# Patient Record
Sex: Male | Born: 1970 | Race: Black or African American | Hispanic: No | Marital: Single
Health system: Southern US, Academic
[De-identification: ages and names within clinical notes are randomized; demographics above are authoritative.]

## PROBLEM LIST (undated history)

## (undated) ENCOUNTER — Ambulatory Visit

## (undated) ENCOUNTER — Encounter

## (undated) ENCOUNTER — Telehealth

## (undated) ENCOUNTER — Encounter: Attending: Medical | Primary: Medical

## (undated) ENCOUNTER — Encounter: Payer: MEDICAID | Attending: Ophthalmology | Primary: Ophthalmology

## (undated) ENCOUNTER — Ambulatory Visit: Attending: Otolaryngology | Primary: Otolaryngology

## (undated) ENCOUNTER — Telehealth: Attending: Family | Primary: Family

## (undated) ENCOUNTER — Ambulatory Visit: Attending: Dentist | Primary: Dentist

## (undated) ENCOUNTER — Telehealth: Attending: Clinical | Primary: Clinical

## (undated) ENCOUNTER — Telehealth: Attending: Registered" | Primary: Registered"

## (undated) ENCOUNTER — Encounter: Attending: Registered" | Primary: Registered"

## (undated) ENCOUNTER — Ambulatory Visit: Payer: MEDICAID

## (undated) ENCOUNTER — Ambulatory Visit: Attending: General Practice | Primary: General Practice

## (undated) ENCOUNTER — Ambulatory Visit: Payer: MEDICAID | Attending: General Practice | Primary: General Practice

## (undated) ENCOUNTER — Ambulatory Visit: Attending: Registered" | Primary: Registered"

## (undated) ENCOUNTER — Encounter: Attending: Family | Primary: Family

## (undated) DIAGNOSIS — I10 Essential (primary) hypertension: Secondary | ICD-10-CM

---
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
## 2007-01-28 ENCOUNTER — Emergency Department: Payer: Self-pay | Admitting: Unknown Physician Specialty

## 2010-07-21 ENCOUNTER — Emergency Department: Payer: Self-pay | Admitting: Emergency Medicine

## 2012-11-10 ENCOUNTER — Emergency Department: Payer: Self-pay | Admitting: Emergency Medicine

## 2014-11-25 IMAGING — CR LEFT GREAT TOE
1 series · 3 of 3 positions shown · non-contrast
Comparison: none

REASON FOR EXAM: pain/edema
COMMENTS:   May transport without cardiac monitor

[Series 1: ap · 0.17mm/px · 3 of 3 slices shown]
[im 1/3]
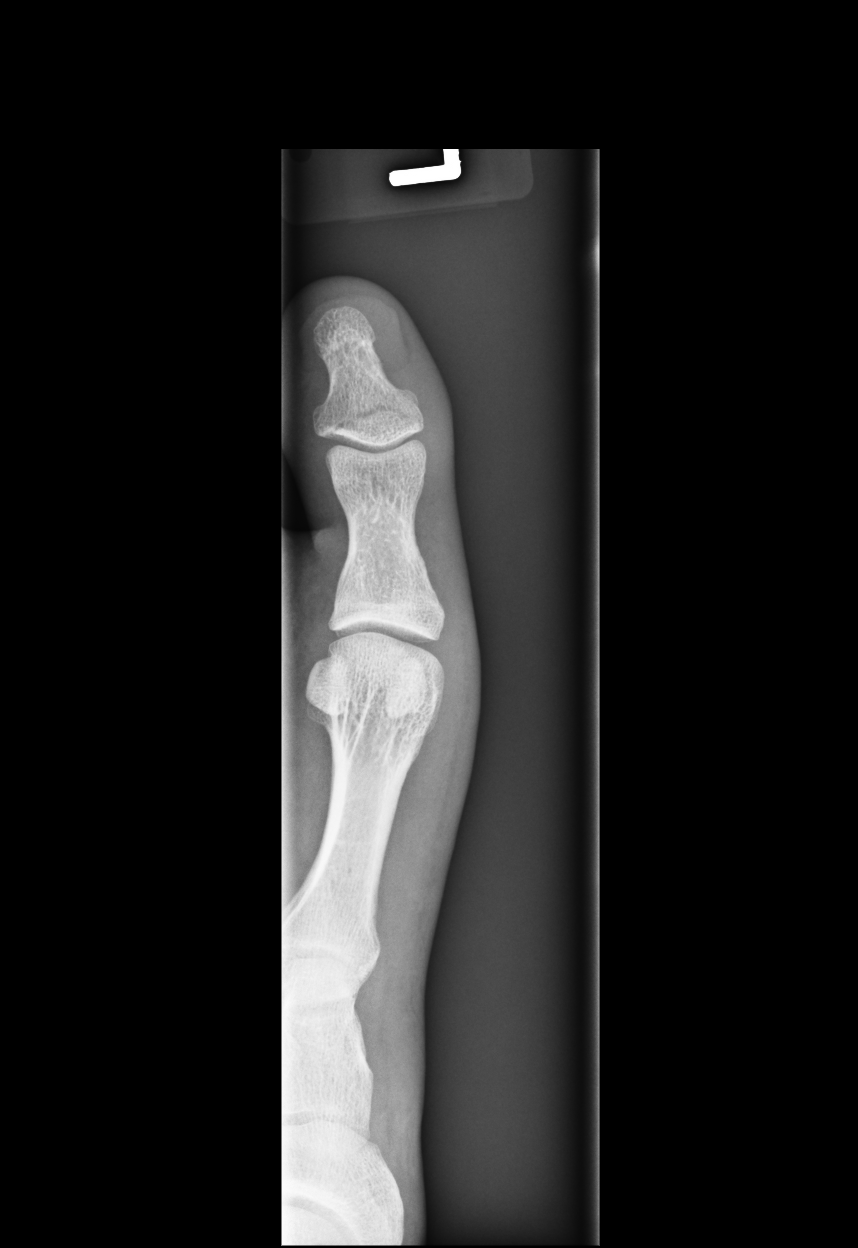
[im 2/3]
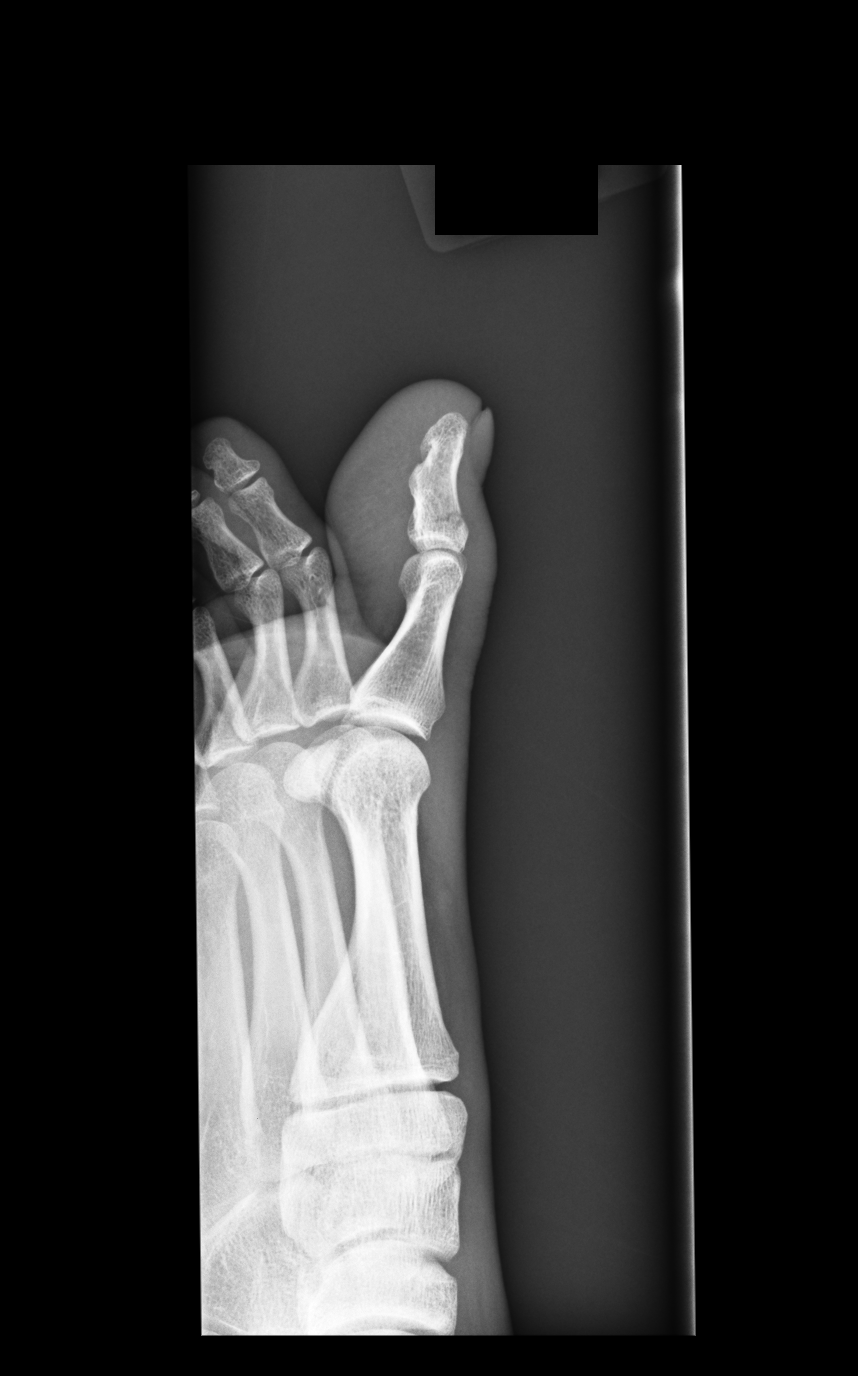
[im 3/3]
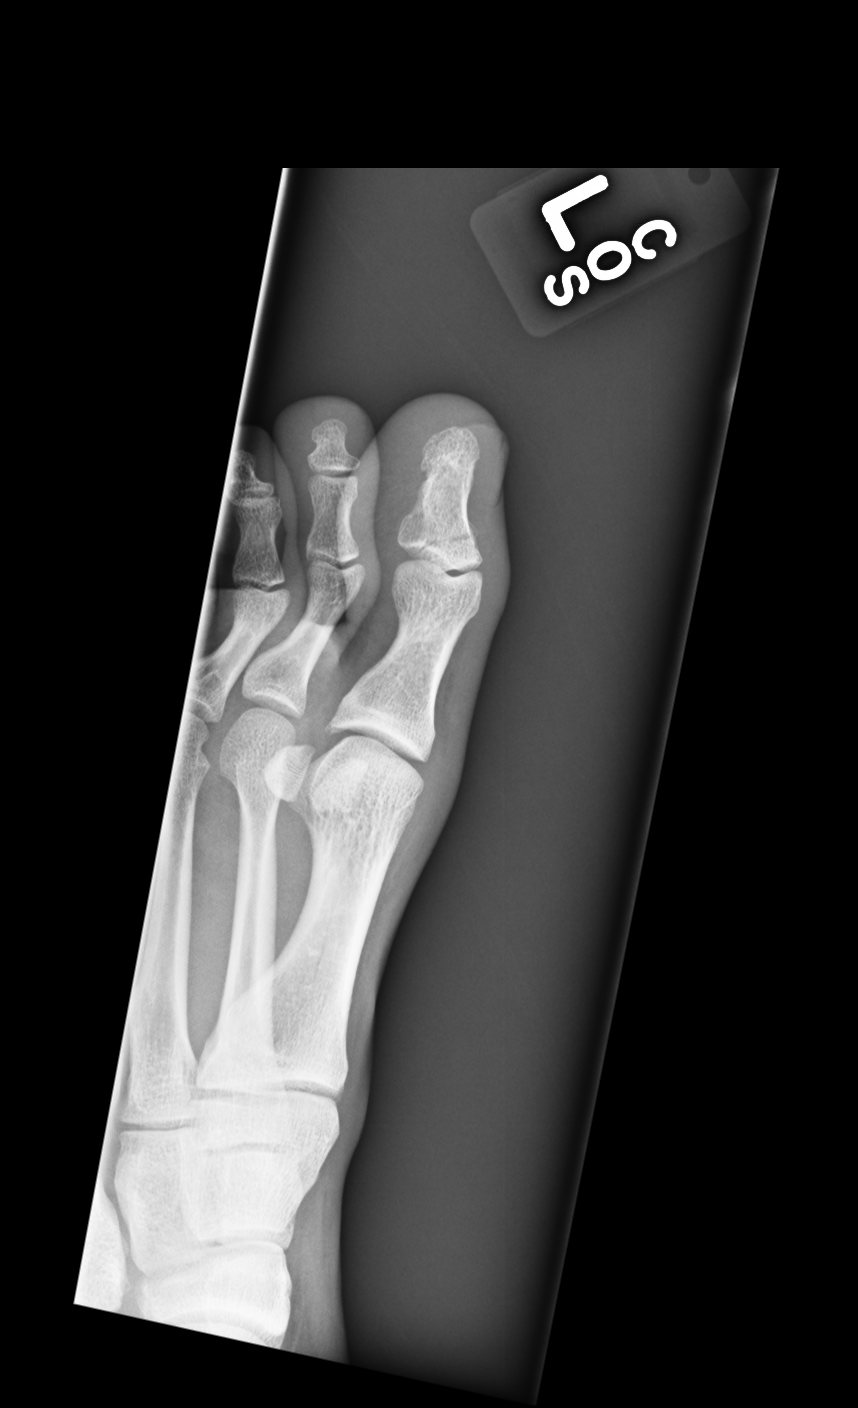

[3 of 3 positions shown; findings below may reference images not displayed]

PROCEDURE:     DXR - DXR TOE GREAT (1ST DIGIT) LT BLAIN  - November 10, 2012  [DATE]

RESULT:     There is lucency in the base of the distal phalanx of the left
great toe consistent with a nondisplaced fracture. There is no angulation,
comminution or distraction. Remaining bony structures appear to be grossly
normal.
IMPRESSION: 1. Fracture in the base of the distal phalanx of the left great toe.

[REDACTED]

## 2016-12-02 ENCOUNTER — Ambulatory Visit: Admission: RE | Admit: 2016-12-02 | Discharge: 2016-12-02 | Disposition: A | Discharge status: Home / Self Care

## 2016-12-02 DIAGNOSIS — B20 Human immunodeficiency virus [HIV] disease: Secondary | ICD-10-CM

## 2016-12-02 DIAGNOSIS — Z8349 Family history of other endocrine, nutritional and metabolic diseases: Secondary | ICD-10-CM

## 2016-12-02 DIAGNOSIS — Z202 Contact with and (suspected) exposure to infections with a predominantly sexual mode of transmission: Principal | ICD-10-CM

## 2016-12-02 MED ORDER — CITALOPRAM 10 MG TABLET
ORAL_TABLET | 11 refills | 0 days | Status: CP
Start: 2016-12-02 — End: 2016-12-11

## 2016-12-02 MED ORDER — BICTEGRAVIR 50 MG-EMTRICITABINE 200 MG-TENOFOVIR ALAFENAM 25 MG TABLET
ORAL_TABLET | Freq: Every day | ORAL | 11 refills | 0.00000 days | Status: CP
Start: 2016-12-02 — End: 2018-03-05

## 2016-12-05 ENCOUNTER — Ambulatory Visit: Admission: RE | Admit: 2016-12-05 | Discharge: 2016-12-05

## 2016-12-11 MED ORDER — CITALOPRAM 10 MG TABLET: tablet | 11 refills | 0 days | Status: AC

## 2016-12-11 MED ORDER — CITALOPRAM 10 MG TABLET
ORAL_TABLET | 11 refills | 0 days | Status: CP
Start: 2016-12-11 — End: 2016-12-19

## 2016-12-19 MED ORDER — CITALOPRAM 10 MG TABLET
ORAL_TABLET | 3 refills | 0 days | Status: CP
Start: 2016-12-19 — End: 2017-01-28

## 2016-12-19 MED ORDER — VALACYCLOVIR 1 GRAM TABLET
ORAL_TABLET | Freq: Every day | ORAL | 3 refills | 0 days | Status: CP
Start: 2016-12-19 — End: 2017-06-04

## 2016-12-23 MED ORDER — METRONIDAZOLE 500 MG TABLET
ORAL_TABLET | Freq: Once | ORAL | 0 refills | 0 days | Status: CP
Start: 2016-12-23 — End: 2016-12-23

## 2017-01-28 ENCOUNTER — Ambulatory Visit: Admission: RE | Admit: 2017-01-28 | Discharge: 2017-01-28 | Disposition: A | Discharge status: Home / Self Care

## 2017-01-28 DIAGNOSIS — I10 Essential (primary) hypertension: Secondary | ICD-10-CM

## 2017-01-28 DIAGNOSIS — K089 Disorder of teeth and supporting structures, unspecified: Secondary | ICD-10-CM

## 2017-01-28 DIAGNOSIS — R7303 Prediabetes: Secondary | ICD-10-CM

## 2017-01-28 DIAGNOSIS — B20 Human immunodeficiency virus [HIV] disease: Principal | ICD-10-CM

## 2017-01-28 DIAGNOSIS — Z720 Tobacco use: Secondary | ICD-10-CM

## 2017-01-28 MED ORDER — AMLODIPINE 5 MG TABLET
ORAL_TABLET | Freq: Every day | ORAL | 5 refills | 0 days | Status: CP
Start: 2017-01-28 — End: 2017-09-21

## 2017-01-28 MED ORDER — BUPROPION HCL SR 150 MG TABLET,12 HR SUSTAINED-RELEASE
ORAL_TABLET | Freq: Every day | ORAL | 5 refills | 0 days | Status: CP
Start: 2017-01-28 — End: 2017-06-05

## 2017-03-16 ENCOUNTER — Ambulatory Visit: Admission: RE | Admit: 2017-03-16 | Discharge: 2017-03-16

## 2017-04-15 DIAGNOSIS — K036 Deposits [accretions] on teeth: Principal | ICD-10-CM

## 2017-04-15 DIAGNOSIS — K029 Dental caries, unspecified: Secondary | ICD-10-CM

## 2017-05-21 ENCOUNTER — Ambulatory Visit: Admit: 2017-05-21 | Discharge: 2017-05-22 | Attending: General Practice | Primary: General Practice

## 2017-05-21 DIAGNOSIS — K029 Dental caries, unspecified: Principal | ICD-10-CM

## 2017-06-05 MED ORDER — BUPROPION HCL SR 150 MG TABLET,12 HR SUSTAINED-RELEASE
ORAL_TABLET | 0 refills | 0 days | Status: CP
Start: 2017-06-05 — End: 2017-10-21

## 2017-06-05 MED ORDER — VALACYCLOVIR 1 GRAM TABLET
ORAL_TABLET | 0 refills | 0 days | Status: CP
Start: 2017-06-05 — End: 2017-07-02

## 2017-06-24 ENCOUNTER — Institutional Professional Consult (permissible substitution): Admit: 2017-06-24 | Discharge: 2017-06-25

## 2017-06-24 DIAGNOSIS — Z006 Encounter for examination for normal comparison and control in clinical research program: Principal | ICD-10-CM

## 2017-07-02 MED ORDER — VALACYCLOVIR 1 GRAM TABLET
ORAL_TABLET | 3 refills | 0 days | Status: CP
Start: 2017-07-02 — End: 2017-11-25

## 2017-07-16 ENCOUNTER — Ambulatory Visit: Admit: 2017-07-16 | Discharge: 2017-07-17

## 2017-07-16 DIAGNOSIS — B20 Human immunodeficiency virus [HIV] disease: Principal | ICD-10-CM

## 2017-09-21 MED ORDER — AMLODIPINE 5 MG TABLET
ORAL_TABLET | Freq: Every day | ORAL | 5 refills | 0.00000 days | Status: CP
Start: 2017-09-21 — End: 2018-03-05

## 2017-10-19 ENCOUNTER — Ambulatory Visit
Admit: 2017-10-19 | Discharge: 2017-10-19 | Disposition: A | Discharge status: Home / Self Care | Payer: MEDICAID | Attending: Emergency Medicine

## 2017-10-21 ENCOUNTER — Ambulatory Visit: Admit: 2017-10-21 | Discharge: 2017-10-22

## 2017-10-21 ENCOUNTER — Institutional Professional Consult (permissible substitution): Admit: 2017-10-21 | Discharge: 2017-10-22

## 2017-10-21 DIAGNOSIS — I10 Essential (primary) hypertension: Secondary | ICD-10-CM

## 2017-10-21 DIAGNOSIS — Z720 Tobacco use: Secondary | ICD-10-CM

## 2017-10-21 DIAGNOSIS — R7303 Prediabetes: Secondary | ICD-10-CM

## 2017-10-21 DIAGNOSIS — B20 Human immunodeficiency virus [HIV] disease: Principal | ICD-10-CM

## 2017-10-21 DIAGNOSIS — E785 Hyperlipidemia, unspecified: Secondary | ICD-10-CM

## 2017-10-21 MED ORDER — BUPROPION HCL SR 150 MG TABLET,12 HR SUSTAINED-RELEASE
ORAL_TABLET | Freq: Two times a day (BID) | ORAL | 5 refills | 0 days | Status: CP
Start: 2017-10-21 — End: 2018-03-05

## 2017-10-21 MED ORDER — ATORVASTATIN 10 MG TABLET
ORAL_TABLET | Freq: Every day | ORAL | 5 refills | 0 days | Status: CP
Start: 2017-10-21 — End: 2018-03-05

## 2017-11-25 MED ORDER — VALACYCLOVIR 1 GRAM TABLET
ORAL_TABLET | Freq: Every day | ORAL | 5 refills | 0 days | Status: CP
Start: 2017-11-25 — End: 2018-03-05

## 2018-03-05 MED ORDER — VALACYCLOVIR 1 GRAM TABLET
ORAL_TABLET | Freq: Every day | ORAL | 5 refills | 0 days | Status: CP
Start: 2018-03-05 — End: 2018-09-08

## 2018-03-05 MED ORDER — BIKTARVY 50 MG-200 MG-25 MG TABLET
ORAL_TABLET | 5 refills | 0 days | Status: CP
Start: 2018-03-05 — End: 2018-09-08

## 2018-03-05 MED ORDER — ATORVASTATIN 10 MG TABLET
ORAL_TABLET | Freq: Every day | ORAL | 5 refills | 0 days | Status: CP
Start: 2018-03-05 — End: 2018-10-12

## 2018-03-05 MED ORDER — AMLODIPINE 5 MG TABLET
ORAL_TABLET | Freq: Every day | ORAL | 5 refills | 0.00000 days | Status: CP
Start: 2018-03-05 — End: 2018-04-02

## 2018-03-05 MED ORDER — BUPROPION HCL SR 150 MG TABLET,12 HR SUSTAINED-RELEASE
ORAL_TABLET | Freq: Two times a day (BID) | ORAL | 5 refills | 0 days | Status: CP
Start: 2018-03-05 — End: 2018-10-13

## 2018-03-18 ENCOUNTER — Institutional Professional Consult (permissible substitution): Admit: 2018-03-18 | Discharge: 2018-03-19

## 2018-04-02 ENCOUNTER — Ambulatory Visit: Admit: 2018-04-02 | Discharge: 2018-04-03

## 2018-04-02 DIAGNOSIS — Z720 Tobacco use: Secondary | ICD-10-CM

## 2018-04-02 DIAGNOSIS — E785 Hyperlipidemia, unspecified: Secondary | ICD-10-CM

## 2018-04-02 DIAGNOSIS — H547 Unspecified visual loss: Secondary | ICD-10-CM

## 2018-04-02 DIAGNOSIS — R7303 Prediabetes: Secondary | ICD-10-CM

## 2018-04-02 DIAGNOSIS — I10 Essential (primary) hypertension: Secondary | ICD-10-CM

## 2018-04-02 DIAGNOSIS — B20 Human immunodeficiency virus [HIV] disease: Principal | ICD-10-CM

## 2018-04-02 MED ORDER — HYDROCHLOROTHIAZIDE 25 MG TABLET
ORAL_TABLET | Freq: Every day | ORAL | 5 refills | 0.00000 days | Status: CP
Start: 2018-04-02 — End: 2018-10-08

## 2018-04-08 ENCOUNTER — Institutional Professional Consult (permissible substitution): Admit: 2018-04-08 | Discharge: 2018-04-09

## 2018-04-19 ENCOUNTER — Ambulatory Visit: Admit: 2018-04-19 | Discharge: 2018-04-20 | Disposition: A | Discharge status: Home / Self Care | Payer: MEDICAID

## 2018-06-08 ENCOUNTER — Encounter: Admit: 2018-06-08 | Discharge: 2018-06-09 | Payer: MEDICAID

## 2018-06-08 DIAGNOSIS — Z006 Encounter for examination for normal comparison and control in clinical research program: Principal | ICD-10-CM

## 2018-09-08 MED ORDER — VALACYCLOVIR 1 GRAM TABLET
ORAL_TABLET | Freq: Every day | ORAL | 5 refills | 0 days | Status: CP
Start: 2018-09-08 — End: ?

## 2018-09-08 MED ORDER — BIKTARVY 50 MG-200 MG-25 MG TABLET
ORAL_TABLET | Freq: Every day | ORAL | 5 refills | 0.00000 days | Status: CP
Start: 2018-09-08 — End: ?

## 2018-10-07 ENCOUNTER — Institutional Professional Consult (permissible substitution): Admit: 2018-10-07 | Discharge: 2018-10-08 | Payer: MEDICAID

## 2018-10-07 DIAGNOSIS — Z113 Encounter for screening for infections with a predominantly sexual mode of transmission: Secondary | ICD-10-CM

## 2018-10-07 DIAGNOSIS — B20 Human immunodeficiency virus [HIV] disease: Principal | ICD-10-CM

## 2018-10-08 ENCOUNTER — Encounter: Admit: 2018-10-08 | Discharge: 2018-10-09 | Payer: MEDICAID

## 2018-10-08 DIAGNOSIS — E785 Hyperlipidemia, unspecified: Secondary | ICD-10-CM

## 2018-10-08 DIAGNOSIS — R7303 Prediabetes: Secondary | ICD-10-CM

## 2018-10-08 DIAGNOSIS — B20 Human immunodeficiency virus [HIV] disease: Principal | ICD-10-CM

## 2018-10-08 DIAGNOSIS — Z720 Tobacco use: Secondary | ICD-10-CM

## 2018-10-08 DIAGNOSIS — I10 Essential (primary) hypertension: Secondary | ICD-10-CM

## 2018-10-08 MED ORDER — AMLODIPINE 5 MG TABLET: 5 mg | tablet | Freq: Every day | 5 refills | 0 days | Status: AC

## 2018-10-08 MED ORDER — AMLODIPINE 5 MG TABLET
ORAL_TABLET | Freq: Every day | ORAL | 5 refills | 0.00000 days | Status: CP
Start: 2018-10-08 — End: 2018-10-08

## 2018-10-12 MED ORDER — ATORVASTATIN 10 MG TABLET
ORAL_TABLET | Freq: Every day | ORAL | 5 refills | 0 days | Status: CP
Start: 2018-10-12 — End: ?

## 2018-10-12 MED ORDER — AMLODIPINE 5 MG TABLET
ORAL_TABLET | Freq: Every day | ORAL | 5 refills | 0 days | Status: CP
Start: 2018-10-12 — End: ?

## 2018-10-13 MED ORDER — BUPROPION HCL SR 150 MG TABLET,12 HR SUSTAINED-RELEASE
ORAL_TABLET | Freq: Two times a day (BID) | ORAL | 5 refills | 0 days | Status: CP
Start: 2018-10-13 — End: ?

## 2018-10-26 ENCOUNTER — Encounter
Admit: 2018-10-26 | Discharge: 2018-10-26 | Disposition: A | Discharge status: Home / Self Care | Payer: BLUE CROSS/BLUE SHIELD

## 2019-01-21 ENCOUNTER — Institutional Professional Consult (permissible substitution): Admit: 2019-01-21 | Discharge: 2019-01-22

## 2019-01-21 DIAGNOSIS — A64 Unspecified sexually transmitted disease: Secondary | ICD-10-CM

## 2019-01-25 DIAGNOSIS — A549 Gonococcal infection, unspecified: Principal | ICD-10-CM

## 2019-01-26 ENCOUNTER — Institutional Professional Consult (permissible substitution): Admit: 2019-01-26 | Discharge: 2019-01-27

## 2019-01-26 DIAGNOSIS — A549 Gonococcal infection, unspecified: Principal | ICD-10-CM

## 2019-06-24 ENCOUNTER — Ambulatory Visit: Payer: Self-pay

## 2019-07-11 ENCOUNTER — Ambulatory Visit: Payer: Self-pay | Attending: Internal Medicine

## 2019-08-01 ENCOUNTER — Ambulatory Visit: Payer: Self-pay

## 2019-09-16 ENCOUNTER — Ambulatory Visit: Payer: Self-pay

## 2019-10-18 ENCOUNTER — Institutional Professional Consult (permissible substitution): Admit: 2019-10-18 | Discharge: 2019-10-19

## 2019-10-18 ENCOUNTER — Other Ambulatory Visit: Payer: Self-pay

## 2019-10-18 ENCOUNTER — Encounter: Payer: Self-pay | Admitting: Family Medicine

## 2019-10-18 ENCOUNTER — Ambulatory Visit: Payer: Self-pay | Admitting: Family Medicine

## 2019-10-18 DIAGNOSIS — Z006 Encounter for examination for normal comparison and control in clinical research program: Principal | ICD-10-CM

## 2019-10-18 DIAGNOSIS — Z113 Encounter for screening for infections with a predominantly sexual mode of transmission: Secondary | ICD-10-CM

## 2019-10-18 DIAGNOSIS — Z202 Contact with and (suspected) exposure to infections with a predominantly sexual mode of transmission: Secondary | ICD-10-CM

## 2019-10-18 MED ORDER — GENTAMICIN SULFATE 40 MG/ML IJ SOLN
240.0000 mg | Freq: Once | INTRAMUSCULAR | Status: AC
  Administered 2019-10-18: 240 mg via INTRAMUSCULAR

## 2019-10-18 MED ORDER — AZITHROMYCIN 500 MG PO TABS
2000.0000 mg | ORAL_TABLET | Freq: Once | ORAL | Status: AC
  Administered 2019-10-18: 2000 mg via ORAL

## 2019-10-18 NOTE — Progress Notes (Signed)
Pt treated as a contact to Gonorrhea per provider order and pt tolerated well. Counseled pt per provider orders and pt states understanding. Provider orders completed.

## 2019-10-18 NOTE — Progress Notes (Signed)
    [facility] Department STI clinic/screening visit  Subjective:  Nathan Ray is a 49 y.o. male being seen today for an STI screening visit. The patient reports they do have symptoms.    Patient has the following medical conditions:  There are no problems to display for this patient.    No chief complaint on file.   HPI  Patient reports he is a contact to [facility] and he has symptoms.  He has itching on head of penis, discomfort with urination and rectal pain.  Last sexual activity was ~11/2 months.  Also, he noted small, flat and dark lesion on L side of trunk couple weeks ago. Denies itching, discharge or pain from area. States that the area has resolved.  He was treated for [facility] contact 05/2019 @[facility] . Client is HIV + and receives his HIV meds/management thru [employer]   See flowsheet for further details and programmatic requirements.    The following portions of the patient's history were reviewed and updated as appropriate: allergies, current medications, past medical history, past social history, past surgical history and problem list.  Objective:  There were no vitals filed for this visit.  Physical Exam Constitutional:      Appearance: Normal appearance.  HENT:     Head: Normocephalic and atraumatic.     Comments: No nits or hair loss    Mouth/Throat:     Mouth: Mucous membranes are moist.     Pharynx: Oropharynx is clear. No oropharyngeal exudate or posterior oropharyngeal erythema.  Pulmonary:     Effort: Pulmonary effort is normal.  Abdominal:     General: Abdomen is flat.     Palpations: Abdomen is soft. There is no hepatomegaly or mass.     Tenderness: There is no abdominal tenderness.  Genitourinary:    Pubic Area: No rash or pubic lice.      Penis: Normal.      Testes: Normal.     Epididymis:     Right: Normal.     Left: Normal.     Rectum: Normal.  Lymphadenopathy:     Head:     Right side of head: No preauricular or posterior auricular  adenopathy.     Left side of head: No preauricular or posterior auricular adenopathy.     Cervical: No cervical adenopathy.     Upper Body:     Right upper body: No supraclavicular or axillary adenopathy.     Left upper body: No supraclavicular or axillary adenopathy.     Lower Body: No right inguinal adenopathy. No left inguinal adenopathy.  Skin:    General: Skin is warm and dry.     Findings: No lesion or rash.  Neurological:     Mental Status: He is alert and oriented to person, place, and time.    Assessment and Plan:  Nathan Ray is a 49 y.o. male presenting to the [facility] Department for STI screening  1. Screening examination for venereal disease  - Chlamydia/Gonorrhea [facility] - Chlamydia/Gonorrhea [facility] - Chlamydia/Gonorrhea [facility]   2. Gonorrhea contact Allergic toPCN -Gentamycin 240 mgIM -Azithromycin 2 gram po x 1  Co. To notify partner for treatment Co no sexual activity x 1 week. Co to use condoms for STD prevention  No follow-ups on file.  No future appointments.  [facility], FNP

## 2020-02-05 DIAGNOSIS — B20 Human immunodeficiency virus [HIV] disease: Principal | ICD-10-CM

## 2020-02-05 DIAGNOSIS — R7303 Prediabetes: Principal | ICD-10-CM

## 2020-02-05 DIAGNOSIS — E785 Hyperlipidemia, unspecified: Principal | ICD-10-CM

## 2020-02-09 ENCOUNTER — Institutional Professional Consult (permissible substitution): Admit: 2020-02-09 | Discharge: 2020-02-09

## 2020-02-09 ENCOUNTER — Ambulatory Visit: Admit: 2020-02-09 | Discharge: 2020-02-09 | Attending: Family | Primary: Family

## 2020-02-09 DIAGNOSIS — Z113 Encounter for screening for infections with a predominantly sexual mode of transmission: Principal | ICD-10-CM

## 2020-02-09 DIAGNOSIS — Z7189 Other specified counseling: Principal | ICD-10-CM

## 2020-02-09 DIAGNOSIS — Z202 Contact with and (suspected) exposure to infections with a predominantly sexual mode of transmission: Principal | ICD-10-CM

## 2020-02-09 DIAGNOSIS — B20 Human immunodeficiency virus [HIV] disease: Principal | ICD-10-CM

## 2020-02-09 DIAGNOSIS — R7303 Prediabetes: Principal | ICD-10-CM

## 2020-02-09 DIAGNOSIS — E785 Hyperlipidemia, unspecified: Principal | ICD-10-CM

## 2020-02-09 MED ORDER — DOXYCYCLINE HYCLATE 100 MG CAPSULE
ORAL_CAPSULE | Freq: Two times a day (BID) | ORAL | 0 refills | 7.00000 days | Status: CP
Start: 2020-02-09 — End: 2020-02-16
  Filled 2020-02-09: qty 14, 7d supply, fill #0

## 2020-02-09 MED ORDER — BIKTARVY 50 MG-200 MG-25 MG TABLET
ORAL_TABLET | Freq: Every day | ORAL | 2 refills | 30 days | Status: CP
Start: 2020-02-09 — End: ?

## 2020-02-09 MED FILL — DOXYCYCLINE HYCLATE 100 MG CAPSULE: 7 days supply | Qty: 14 | Fill #0 | Status: AC

## 2020-02-13 ENCOUNTER — Ambulatory Visit: Admit: 2020-02-13 | Discharge: 2020-02-14

## 2020-04-14 ENCOUNTER — Ambulatory Visit
Admit: 2020-04-14 | Discharge: 2020-04-14 | Disposition: A | Discharge status: Home / Self Care | Payer: MEDICAID | Attending: Emergency Medicine

## 2020-04-14 DIAGNOSIS — Z79899 Other long term (current) drug therapy: Principal | ICD-10-CM

## 2020-04-14 DIAGNOSIS — E785 Hyperlipidemia, unspecified: Principal | ICD-10-CM

## 2020-04-14 DIAGNOSIS — J069 Acute upper respiratory infection, unspecified: Principal | ICD-10-CM

## 2020-04-14 DIAGNOSIS — F17210 Nicotine dependence, cigarettes, uncomplicated: Principal | ICD-10-CM

## 2020-04-14 DIAGNOSIS — B9789 Other viral agents as the cause of diseases classified elsewhere: Principal | ICD-10-CM

## 2020-04-14 DIAGNOSIS — Z21 Asymptomatic human immunodeficiency virus [HIV] infection status: Principal | ICD-10-CM

## 2020-04-14 DIAGNOSIS — R059 Cough, unspecified: Principal | ICD-10-CM

## 2020-04-14 DIAGNOSIS — I10 Essential (primary) hypertension: Principal | ICD-10-CM

## 2020-04-14 DIAGNOSIS — Z882 Allergy status to sulfonamides status: Principal | ICD-10-CM

## 2020-05-23 ENCOUNTER — Ambulatory Visit: Admit: 2020-05-23 | Discharge: 2020-05-23

## 2020-05-23 ENCOUNTER — Institutional Professional Consult (permissible substitution): Admit: 2020-05-23 | Discharge: 2020-05-23

## 2020-05-23 DIAGNOSIS — I10 Essential (primary) hypertension: Principal | ICD-10-CM

## 2020-05-23 DIAGNOSIS — Z882 Allergy status to sulfonamides status: Principal | ICD-10-CM

## 2020-05-23 DIAGNOSIS — Z720 Tobacco use: Principal | ICD-10-CM

## 2020-05-23 DIAGNOSIS — Z1211 Encounter for screening for malignant neoplasm of colon: Principal | ICD-10-CM

## 2020-05-23 DIAGNOSIS — E785 Hyperlipidemia, unspecified: Principal | ICD-10-CM

## 2020-05-23 DIAGNOSIS — Z111 Encounter for screening for respiratory tuberculosis: Principal | ICD-10-CM

## 2020-05-23 DIAGNOSIS — Z006 Encounter for examination for normal comparison and control in clinical research program: Principal | ICD-10-CM

## 2020-05-23 DIAGNOSIS — B20 Human immunodeficiency virus [HIV] disease: Principal | ICD-10-CM

## 2020-05-23 DIAGNOSIS — R7303 Prediabetes: Principal | ICD-10-CM

## 2020-05-23 MED ORDER — ATORVASTATIN 10 MG TABLET
ORAL_TABLET | Freq: Every day | ORAL | 5 refills | 30.00000 days | Status: CP
Start: 2020-05-23 — End: ?

## 2020-05-23 MED ORDER — BIKTARVY 50 MG-200 MG-25 MG TABLET
ORAL_TABLET | Freq: Every day | ORAL | 5 refills | 30.00000 days | Status: CP
Start: 2020-05-23 — End: ?

## 2020-05-23 MED ORDER — BUPROPION HCL SR 150 MG TABLET,12 HR SUSTAINED-RELEASE
ORAL_TABLET | Freq: Two times a day (BID) | ORAL | 5 refills | 30.00000 days | Status: CP
Start: 2020-05-23 — End: ?

## 2020-05-23 MED ORDER — AMLODIPINE 5 MG TABLET
ORAL_TABLET | Freq: Every day | ORAL | 5 refills | 30.00000 days | Status: CP
Start: 2020-05-23 — End: ?

## 2020-06-25 DIAGNOSIS — I10 Essential (primary) hypertension: Principal | ICD-10-CM

## 2020-06-25 DIAGNOSIS — Z720 Tobacco use: Principal | ICD-10-CM

## 2020-06-25 DIAGNOSIS — B20 Human immunodeficiency virus [HIV] disease: Principal | ICD-10-CM

## 2020-06-25 MED ORDER — ATORVASTATIN 10 MG TABLET
ORAL_TABLET | Freq: Every day | ORAL | 5 refills | 30 days | Status: CP
Start: 2020-06-25 — End: ?
  Filled 2020-06-27: qty 30, 30d supply, fill #0

## 2020-06-25 MED ORDER — AMLODIPINE 5 MG TABLET
ORAL_TABLET | Freq: Every day | ORAL | 5 refills | 30 days | Status: CP
Start: 2020-06-25 — End: ?
  Filled 2020-06-27: qty 30, 30d supply, fill #0

## 2020-06-25 MED ORDER — VALACYCLOVIR 1 GRAM TABLET
ORAL_TABLET | Freq: Every day | ORAL | 5 refills | 30 days | Status: CP
Start: 2020-06-25 — End: ?
  Filled 2020-06-27: qty 30, 30d supply, fill #0

## 2020-06-25 MED ORDER — BIKTARVY 50 MG-200 MG-25 MG TABLET
ORAL_TABLET | Freq: Every day | ORAL | 5 refills | 30 days | Status: CP
Start: 2020-06-25 — End: ?
  Filled 2020-06-27: qty 30, 30d supply, fill #0

## 2020-06-25 MED ORDER — BUPROPION HCL SR 150 MG TABLET,12 HR SUSTAINED-RELEASE
ORAL_TABLET | Freq: Two times a day (BID) | ORAL | 5 refills | 30 days | Status: CP
Start: 2020-06-25 — End: ?
  Filled 2020-06-27: qty 60, 30d supply, fill #0

## 2020-06-26 DIAGNOSIS — B20 Human immunodeficiency virus [HIV] disease: Principal | ICD-10-CM

## 2020-06-27 NOTE — Unmapped (Signed)
[facility] Specialty Medication Onboarding    Specialty Medication: Radio producer  Prior Authorization: Not Required   Financial Assistance: No - copay  <$25  Final Copay/Day Supply: $0 / 30 (Temp PAP)    Insurance Restrictions: None     Notes to Pharmacist: Amlodipine, atorvastatin, bupropion, and valacyclovir $0 with temp PAP    The triage team has completed the benefits investigation and has determined that the patient is able to fill this medication at [facility]. Please contact the patient to complete the onboarding or follow up with the prescribing physician as needed.
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
## 2020-07-04 NOTE — Unmapped (Signed)
Benefits Counselor calculated income provided by patient. Patient is ineligible to receive RW B & C services except for Caps on Charges. Patients' income is over 300% [employer]. Expires: 06/24/2021    Rhetta Mura  ID Clinic Benefits Counselor  Time of Intervention-98mins

## 2020-07-24 NOTE — Unmapped (Signed)
[facility] Clinical Assessment & Refill Coordination Note    Greg Daniel, [place]: January 25, 1971  Phone: [phone] (home)     All above HIPAA information was verified with patient.     Was a Nurse, learning disability used for this call? No    Specialty Medication(s):   Infectious Disease: Biktarvy     Current Outpatient Medications   Medication Sig Dispense Refill   ??? amLODIPine (NORVASC) 5 MG tablet Take 1 tablet (5 mg total) by mouth daily. 30 tablet 5   ??? atorvastatin (LIPITOR) 10 MG tablet Take 1 tablet (10 mg total) by mouth daily. 30 tablet 5   ??? bictegrav-emtricit-tenofov ala (BIKTARVY) 50-200-25 mg tablet Take 1 tablet by mouth daily. 30 tablet 5   ??? buPROPion (WELLBUTRIN SR) 150 MG 12 hr tablet Take 1 tablet (150 mg total) by mouth Two (2) times a day. 60 tablet 5   ??? valACYclovir (VALTREX) 1000 MG tablet Take 1 tablet (1,000 mg total) by mouth daily. 30 tablet 5     No current facility-administered medications for this visit.        Changes to medications: Greg Daniel reports no changes at this time.    Allergies   Allergen Reactions   ??? Sulfa (Sulfonamide Antibiotics) Rash       Changes to allergies: No    SPECIALTY MEDICATION ADHERENCE     Biktarvy   : approximately 10 days of medicine on hand     Medication Adherence    Patient reported X missed doses in the last month: 2  Specialty Medication: Biktarvy  Patient is on additional specialty medications: No  Informant: patient  Provider-estimated medication adherence level: variable  Patient is at risk for Non-Adherence: Yes  The following intervention(s) were discussed with the patient: Always travel with medications   Other non-adherence reason: Being away from home and forgetting to take with him           Specialty medication(s) dose(s) confirmed: Regimen is correct and unchanged.     Are there any concerns with adherence? Yes: missed up to 2 doses    Adherence counseling provided? Yes: I recommended he get a key chain pill container to help when he is away from home unexpectedly or leaves and forgets to take his medicine,    CLINICAL MANAGEMENT AND INTERVENTION      Clinical Benefit Assessment:    Do you feel the medicine is effective or helping your condition? Yes    HIV ASSOCIATED LABS:     Lab Results   Component Value Date/Time    HIVRS Detected (A) 05/23/2020 11:53 AM    HIVRS Not Detected 02/09/2020 01:32 PM    HIVRS Not Detected 10/07/2018 02:03 PM    HIVRS Detected 05/31/2014 02:05 PM    HIVCP <40 (H) 05/23/2020 11:53 AM    HIVCP <40 (H) 04/02/2018 09:52 AM    HIVCP <40 (H) 01/28/2017 09:47 AM    HIVCP <40 05/31/2014 02:05 PM    HIVCP 270 01/02/2014 10:48 AM    HIVCP 91916 07/21/2013 12:11 PM    ACD4 1,080 02/09/2020 01:32 PM    ACD4 784 10/07/2018 02:03 PM    ACD4 824 12/02/2016 11:49 AM    ACD4 423 (L) 05/31/2014 02:05 PM    ACD4 345 (L) 01/02/2014 10:48 AM    ACD4 336 (L) 07/21/2013 12:11 PM       Clinical Benefit counseling provided? Labs from 05/23/20 show evidence of clinical benefit    Adverse  Effects Assessment:    Are you experiencing any side effects? No    Are you experiencing difficulty administering your medicine? No    Quality of Life Assessment:    How many days over the past month did your HIV  keep you from your normal activities? For example, brushing your teeth or getting up in the morning. 0    Have you discussed this with your provider? Not needed    Acute Infection Status:    Acute infections noted within Epic:  No active infections  Patient reported infection: None    Therapy Appropriateness:    Is therapy appropriate? Yes, therapy is appropriate and should be continued    DISEASE/MEDICATION-SPECIFIC INFORMATION      N/A    PATIENT SPECIFIC NEEDS     - Does the patient have any physical, cognitive, or cultural barriers? No    - Is the patient high risk? No    - Does the patient require a Care Management Plan? No     - Does the patient require physician intervention or other additional services (i.e. nutrition, smoking cessation, social work)? No      SHIPPING     Specialty Medication(s) to be Shipped:   Infectious Disease: Biktarvy    Other medication(s) to be shipped: amlodipine, atorvastatin, bupropion, and valacyclovir     Changes to insurance: temporary [place] PAP expired. Working on getting mfg assistance and PAP or HMAP/UMAP.  I called the PAP office and had them send him another PAP application and will reach out to financial counselors in the ID Clinic.    Delivery Scheduled: No. Currently listed as over income for HMAP and mfg assistance is pending.         The patient will receive a drug information handout for each medication shipped and additional FDA Medication Guides as required.  Verified that patient has previously received a Conservation officer, historic buildings and a Surveyor, mining.    All of the patient's questions and concerns have been addressed.    Greg Daniel   [facility] Specialty Pharmacist

## 2020-07-25 NOTE — Unmapped (Signed)
Greg Daniel is a lovely 50 y.o. male here for a Baseline MWCCS Study Visit (IRB [phone]) on 07/25/2020. HIV positive participant. Denies complaints today. Pt is engaged in care at [facility], takes [place] for ARVs.      Negative COVID-19 Screen: Wellness screening and temperature check conducted at clinic door. Participant reported no COVID-19 symptoms, no contact to a known or suspected COVID-19 case in the last 14 days, and temperature was within normal limits. Participant was cleared to enter the facility.     Study visit included:    Physical Exam:  Blood Pressure, Height/Weight, Frailty, In-Body Analysis , BIA Body Measurements  and Fibroscan     Baseline Labs Collected: Comprehensive Metabolic Panel (14), CBC w/diff, CD4, HIV Viral Load, fasting glucose, HgA1C, fasting lipid panel, HBV Evaluation Profile, HCV Antibody, RPR Cascade.     Specimens Collected: GC/CH: Urine, Pharynx, Rectum   Pt accepted a stool collection kit     All labs results will be documented in Epic.

## 2020-07-27 LAB — T-LYMPHOCYTE HELPER/SUPPRESSOR
% CD 3 POS. LYMPH.: 73.9 % (ref 57.5–86.2)
ABSOLUTE CD 3: 1995 /uL (ref 622–2402)
ABSOLUTE CD8 CNT: 1242 /uL — ABNORMAL HIGH (ref 109–897)
BANDED NEUTROPHILS ABSOLUTE COUNT: 0 10*3/uL (ref 0.0–0.1)
BASOPHILS ABSOLUTE COUNT: 0.1 10*3/uL (ref 0.0–0.2)
BASOPHILS RELATIVE PERCENT: 1 %
CD4 % HELPER T CELL: 28.3 % — ABNORMAL LOW (ref 30.8–58.5)
CD4 T CELL ABSOLUTE: 764 /uL (ref 359–1519)
CD4:CD8 RATIO: 0.62 — ABNORMAL LOW (ref 0.92–3.72)
CD8 % SUPPRESSOR T CELL: 46 % — ABNORMAL HIGH (ref 12.0–35.5)
EOSINOPHILS ABSOLUTE COUNT: 0.3 10*3/uL (ref 0.0–0.4)
EOSINOPHILS RELATIVE PERCENT: 3 %
HEMATOCRIT: 45.6 % (ref 37.5–51.0)
HEMOGLOBIN: 15.5 g/dL (ref 13.0–17.7)
IMMATURE GRANULOCYTES: 0 %
LYMPHOCYTES ABSOLUTE COUNT: 2.7 10*3/uL (ref 0.7–3.1)
LYMPHOCYTES RELATIVE PERCENT: 29 %
MEAN CORPUSCULAR HEMOGLOBIN CONC: 34 g/dL (ref 31.5–35.7)
MEAN CORPUSCULAR HEMOGLOBIN: 32.8 pg (ref 26.6–33.0)
MEAN CORPUSCULAR VOLUME: 96 fL (ref 79–97)
MONOCYTES ABSOLUTE COUNT: 0.6 10*3/uL (ref 0.1–0.9)
MONOCYTES RELATIVE PERCENT: 6 %
NEUTROPHILS ABSOLUTE COUNT: 5.7 10*3/uL (ref 1.4–7.0)
NEUTROPHILS RELATIVE PERCENT: 61 %
PLATELET COUNT: 312 10*3/uL (ref 150–450)
RED BLOOD CELL COUNT: 4.73 x10E6/uL (ref 4.14–5.80)
RED CELL DISTRIBUTION WIDTH: 13 % (ref 11.6–15.4)
WHITE BLOOD CELL COUNT: 9.4 10*3/uL (ref 3.4–10.8)

## 2020-07-27 LAB — LP+NON-HDL CHOLESTEROL
CHOLESTEROL, TOTAL: 168 mg/dL (ref 100–199)
HDL CHOLESTEROL: 31 mg/dL — ABNORMAL LOW
LDL CHOLESTEROL CALCULATED: 104 mg/dL — ABNORMAL HIGH (ref 0–99)
NON-HDL CHOLESTEROL: 137 mg/dL — ABNORMAL HIGH (ref 0–129)
TRIGLYCERIDES: 186 mg/dL — ABNORMAL HIGH (ref 0–149)
VLDL CHOLESTEROL CAL: 33 mg/dL (ref 5–40)

## 2020-07-27 LAB — CMP14+1AC
A/G RATIO: 1.4 (ref 1.2–2.2)
ALBUMIN: 4.4 g/dL (ref 4.0–5.0)
ALKALINE PHOSPHATASE: 66 IU/L (ref 44–121)
ALT (SGPT): 21 IU/L (ref 0–44)
AST (SGOT): 20 IU/L (ref 0–40)
BILIRUBIN TOTAL: 0.3 mg/dL (ref 0.0–1.2)
BLOOD UREA NITROGEN: 11 mg/dL (ref 6–24)
BUN / CREAT RATIO: 10 (ref 9–20)
CALCIUM: 9.2 mg/dL (ref 8.7–10.2)
CHLORIDE: 103 mmol/L (ref 96–106)
CO2: 18 mmol/L — ABNORMAL LOW (ref 20–29)
CREATININE: 1.11 mg/dL (ref 0.76–1.27)
EGFR: 81 mL/min/{1.73_m2}
GAMMA GLUTAMYL TRANSFERASE: 29 IU/L (ref 0–65)
GLOBULIN, TOTAL: 3.1 g/dL (ref 1.5–4.5)
GLUCOSE: 177 mg/dL — ABNORMAL HIGH (ref 65–99)
POTASSIUM: 4.1 mmol/L (ref 3.5–5.2)
SODIUM: 140 mmol/L (ref 134–144)
TOTAL PROTEIN: 7.5 g/dL (ref 6.0–8.5)

## 2020-07-27 LAB — HBV EVALUATION PROFILE
HEP B SURFACE AB, QUAL: REACTIVE
HEPATITIS B CORE TOTAL ANTIBODY: NEGATIVE
HEPATITIS B SURFACE ANTIGEN: NEGATIVE

## 2020-07-27 LAB — HEMOGLOBIN A1C: HEMOGLOBIN A1C: 6.4 % — ABNORMAL HIGH (ref 4.8–5.6)

## 2020-07-27 LAB — T PALLIDUM SCREENING CASCADE: T PALLIDUM ANTIBODIES (TP-PA): NONREACTIVE

## 2020-07-27 LAB — HEPATITIS C ANTIBODY: HEP C VIRUS AB: <0.1 {s_co_ratio} (ref 0.0–0.9)

## 2020-07-30 NOTE — Unmapped (Signed)
Received a chat from [employer] ([employer])  regarding patients [employer] application. Per  Charmayne Sheer, patient was denied assistance due to not completing an HMAP application. Informed [place], patient's application wasn't submitted because he was over income. Per Jeneen Rinks required a denial letter to appeal the denial with their program. Otho Darner a denial letter from the benefits team.      Rhetta Mura  ID Clinic Benefits Counselor  Time of Intervention-66mins

## 2020-07-30 NOTE — Unmapped (Signed)
Submitted HMAP application for denial letter.    Greg Daniel  ID Clinic Benefits Counselor  Time of Intervention-38mins

## 2020-07-31 NOTE — Unmapped (Signed)
MWCCS study labs received and reviewed:    HIV RNA 30 copies m/L  CD4, CBC, CMP within range for patient  (non fasting glucose)  Lipid panel elevated: triglycerides 186; LDL 104; Non-HDL 137 (improved from visit 5 months ago)  HA1c 6.4%  Hepatitis B panel negative with reactive surface Ab  HCV negative; syphilis negative    Labs available in Epic. Will forward to his ID provider, Dr. Maurine Minister.  Urine/Rectal/Pharyngeal GC/CH negative

## 2020-08-13 NOTE — Unmapped (Signed)
Update:    Mr. Beauchaine manufacturer assistance is still pending.  No medication has been shipped since 06/27/20.    Corliss Skains. [place], [place].D.  Specialty Pharmacist  [facility]  [phone] option 4

## 2020-08-13 NOTE — Unmapped (Signed)
Received a message from [place] regarding patients denied HMAP application. Per Tammy Sours, gilead would not accept the initial denial and would only accept an official letter. Emailed Charmayne Sheer an official denial letter.     Received a voicemail from patient regarding bills and collects. Returned patients call to provide [employer] number ( [phone]) but was unable to leave a message due to mailbox is full message.      Rhetta Mura  ID Clinic Benefits Counselor  Time of Intervention-43mins

## 2020-09-03 MED FILL — BIKTARVY 50 MG-200 MG-25 MG TABLET: ORAL | 30 days supply | Qty: 30 | Fill #1

## 2020-09-03 NOTE — Unmapped (Addendum)
Update:    Mr. Borner mfg assistance for Susanne Borders is approved.  I told Mr. Swor that we will have it sent to his prescription address in [facility] today via the Same Day Courier. He told me he has only been out for one week when he should have been out for almost a month based on the quantity of Biktarvy reported in April.   Also, I will ask a counselor in the Pharmacy Assistance Program to send him another  PAP application to cover his other medications.    Corliss Skains. [place], [place].D.  Specialty Pharmacist  [facility]  [phone] option 4

## 2020-09-04 NOTE — Unmapped (Addendum)
Greg Daniel 's Biktarvy shipment will be delayed as a result of UPS returned package . Order was not handed off to [facility] courier.    I have reached out to the patient  at [phone] and communicated the delivery change. We will reschedule the medication for the delivery date that the patient agreed upon.  We have confirmed the delivery date as 5/25, via same day courier.

## 2020-09-26 NOTE — Unmapped (Signed)
Mr, Daniel has not filled out the second PAP application that was mailed to him on 09/03/20. He reports not being out of his amlodipine, atorvastatin, or valacyclovir    [facility] Clinical Assessment & Refill Coordination Note    Greg Daniel, DOB: 07-Jan-1971  Phone: [phone] (home)     All above HIPAA information was verified with patient.     Was a Nurse, learning disability used for this call? No    Specialty Medication(s):   Infectious Disease: Biktarvy     Current Outpatient Medications   Medication Sig Dispense Refill   ??? amLODIPine (NORVASC) 5 MG tablet Take 1 tablet (5 mg total) by mouth daily. 30 tablet 5   ??? atorvastatin (LIPITOR) 10 MG tablet Take 1 tablet (10 mg total) by mouth daily. 30 tablet 5   ??? bictegrav-emtricit-tenofov ala (BIKTARVY) 50-200-25 mg tablet Take 1 tablet by mouth daily. 30 tablet 5   ??? valACYclovir (VALTREX) 1000 MG tablet Take 1 tablet (1,000 mg total) by mouth daily. 30 tablet 5     No current facility-administered medications for this visit.        Changes to medications: Greg Daniel Reports stopping the following medications: bupropion    Allergies   Allergen Reactions   ??? Sulfa (Sulfonamide Antibiotics) Rash       Changes to allergies: No    SPECIALTY MEDICATION ADHERENCE     Biktarvy   : approximately 7 days of medicine on hand       Medication Adherence    Patient reported X missed doses in the last month: 0  Specialty Medication: Biktarvy  Patient is on additional specialty medications: No  Demonstrates understanding of importance of adherence: yes  Informant: patient  Provider-estimated medication adherence level: good  Patient is at risk for Non-Adherence: No          Specialty medication(s) dose(s) confirmed: Regimen is correct and unchanged.     Are there any concerns with adherence? No    Adherence counseling provided? Not needed    CLINICAL MANAGEMENT AND INTERVENTION      Clinical Benefit Assessment:    Do you feel the medicine is effective or helping your condition? Yes    HIV ASSOCIATED LABS:     Lab Results   Component Value Date/Time    HIVRS Detected (A) 05/23/2020 11:53 AM    HIVRS Not Detected 02/09/2020 01:32 PM    HIVRS Not Detected 10/07/2018 02:03 PM    HIVRS Detected 05/31/2014 02:05 PM    HIVCP <40 (H) 05/23/2020 11:53 AM    HIVCP <40 (H) 04/02/2018 09:52 AM    HIVCP <40 (H) 01/28/2017 09:47 AM    HIVCP <40 05/31/2014 02:05 PM    HIVCP 270 01/02/2014 10:48 AM    HIVCP 91916 07/21/2013 12:11 PM    ACD4 1,080 02/09/2020 01:32 PM    ACD4 784 10/07/2018 02:03 PM    ACD4 824 12/02/2016 11:49 AM    ACD4 423 (L) 05/31/2014 02:05 PM    ACD4 345 (L) 01/02/2014 10:48 AM    ACD4 336 (L) 07/21/2013 12:11 PM       Clinical Benefit counseling provided? Labs from 05/23/20 show evidence of clinical benefit    Adverse Effects Assessment:    Are you experiencing any side effects? No    Are you experiencing difficulty administering your medicine? No    Quality of Life Assessment:    How many days over the past month did your HIV  keep  you from your normal activities? For example, brushing your teeth or getting up in the morning. 0    Have you discussed this with your provider? Not needed    Acute Infection Status:    Acute infections noted within Epic:  No active infections  Patient reported infection: None    Therapy Appropriateness:    Is therapy appropriate? Yes, therapy is appropriate and should be continued    DISEASE/MEDICATION-SPECIFIC INFORMATION      N/A    PATIENT SPECIFIC NEEDS     - Does the patient have any physical, cognitive, or cultural barriers? No    - Is the patient high risk? No    - Does the patient require a Care Management Plan? No     - Does the patient require physician intervention or other additional services (i.e. nutrition, smoking cessation, social work)? No      SHIPPING     Specialty Medication(s) to be Shipped:   Infectious Disease: Biktarvy    Other medication(s) to be shipped: No additional medications requested for fill at this time     Changes to insurance: No    Delivery Scheduled: Yes, Expected medication delivery date: 09/28/20.     Medication will be delivered via Next Day Courier to the confirmed prescription address in [facility].    The patient will receive a drug information handout for each medication shipped and additional FDA Medication Guides as required.  Verified that patient has previously received a Conservation officer, historic buildings and a Surveyor, mining.    The patient or caregiver noted above participated in the development of this care plan and knows that they can request review of or adjustments to the care plan at any time.      All of the patient's questions and concerns have been addressed.    Roderic Palau   [facility] Specialty Pharmacist

## 2020-09-27 MED FILL — BIKTARVY 50 MG-200 MG-25 MG TABLET: ORAL | 30 days supply | Qty: 30 | Fill #2

## 2020-10-11 ENCOUNTER — Institutional Professional Consult (permissible substitution): Admit: 2020-10-11 | Discharge: 2020-10-12

## 2020-11-06 NOTE — Unmapped (Signed)
[facility] Refill Coordination Note    Specialty Medication(s) to be Shipped:   Infectious Disease: Biktarvy    Other medication(s) to be shipped: amlodipine  Atorvastatin  Valacyclovir       Greg Daniel, DOB: 1971/01/14  Phone: [phone] (home)       All above HIPAA information was verified with patient.     Was a Nurse, learning disability used for this call? No    Completed refill call assessment today to schedule patient's medication shipment from the [facility] [phone]).  All relevant notes have been reviewed.     Specialty medication(s) and dose(s) confirmed: Regimen is correct and unchanged.   Changes to medications: [name] reports no changes at this time.  Changes to insurance: No  New side effects reported not previously addressed with a pharmacist or physician: None reported  Questions for the pharmacist: No    Confirmed patient received a Conservation officer, historic buildings and a Surveyor, mining with first shipment. The patient will receive a drug information handout for each medication shipped and additional FDA Medication Guides as required.       DISEASE/MEDICATION-SPECIFIC INFORMATION        N/A    SPECIALTY MEDICATION ADHERENCE     Medication Adherence    Patient reported X missed doses in the last month: 0  Specialty Medication: Biktarvy  Patient is on additional specialty medications: No              Were doses missed due to medication being on hold? No    Unable to confirm quantity on hand    REFERRAL TO PHARMACIST     Referral to the pharmacist: Not needed      [facility]     Shipping address confirmed in Epic.     Delivery Scheduled: Yes, Expected medication delivery date: 7/27.     Medication will be delivered via Same Day Courier to the prescription address in Epic WAM.    Greg Daniel   [facility] Specialty Technician

## 2020-11-07 NOTE — Unmapped (Addendum)
Dominic Pea 's Biktarvy shipment will be delayed as a result of a different copay. regarding pts other scheduled prescriptions.    I have reached out to the patient  at [phone] and was unable to leave a voicemail (mailbox full), a text message was also sent to pt. We will wait for a call back from the patient to reschedule the delivery.  We have not confirmed the new delivery date.

## 2020-11-08 MED FILL — ATORVASTATIN 10 MG TABLET: ORAL | 30 days supply | Qty: 30 | Fill #1

## 2020-11-08 MED FILL — AMLODIPINE 5 MG TABLET: ORAL | 30 days supply | Qty: 30 | Fill #1

## 2020-11-08 MED FILL — VALACYCLOVIR 1 GRAM TABLET: ORAL | 30 days supply | Qty: 30 | Fill #1

## 2020-11-08 MED FILL — BIKTARVY 50 MG-200 MG-25 MG TABLET: ORAL | 30 days supply | Qty: 30 | Fill #3

## 2020-12-10 NOTE — Unmapped (Signed)
The [facility] has made a third and final attempt to reach this patient to refill the following medication: Biktarvy.      We have left voicemails on the following phone numbers: [phone], have been unable to leave messages on the following phone numbers: [phone] and have sent a MyChart message.    Dates contacted: 8/19, 8/23, 8/29  Last scheduled delivery: shipped 7/28    The patient may be at risk of non-compliance with this medication. The patient should call the [facility] at [phone]  Option 4, then Option 2 (all other specialty patients) to refill medication.    Greg Daniel   [facility] Specialty Technician

## 2021-01-23 ENCOUNTER — Ambulatory Visit: Admit: 2021-01-23

## 2021-01-23 NOTE — Unmapped (Unsigned)
Subjective:    Greg Daniel is a 50 y.o. male who presents for No chief complaint on file.    Last seen 05/2020   HIV RNA not detected on Biktarvy       Labs done in 07/2020   CD4 764/28%  TPPA NR   HCV ab NR       ----    Feels well today. Concerned that his medications deliveries have stopped. Thinks he did not renew HMAP. Has not run out of any meds though. Thinks he has about 12 days left.     Doing well on Biktarvy. No missed doses in past week. However admits to 3-4 missed per month. He attributes this to stress. Coming home after working 12 hour shifts and being tired. 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 He has realized that he needs to take care of himself so he started putting pills in is car so he takes consistently everyday.      Eating healthier   Had quit smoking x 3 months  - then restarted about one month ago. Smokes about 3-4 per day.   Did better when on Wellbutrin and wants refill of this.   Asks about colon cancer screening .         Past Medical History:   Diagnosis Date   ??? Chlamydia 05-2014    Rectal    ??? Difficulty concentrating 08/26/2013   ??? Dyslipidemia    ??? Gonorrhea     Rectal 05/2014; 11/2016   ??? Herpes    ??? HIV (human immunodeficiency virus infection) (CMS-HCC) Diagnosed 2010   ??? Hypertension 2018   ??? Seasonal allergies    ??? Tobacco abuse 07/27/2013     Past Surgical History: None   No past surgical history on file.    Social History:    Living with mom.   Working as Lawyer.    High school graduate. He is currently taking college classes.   Sex with men. Last sexual encounter used condoms. Wants STI screen.   Habits: rare etoh, +tobacco smoking few cigs per day. See HPI.   No illicit drugs.      Allergies: Sulfa (sulfonamide antibiotics)     Medications:    Current Outpatient Medications on File Prior to Visit   Medication Sig   ??? amLODIPine (NORVASC) 5 MG tablet Take 1 tablet (5 mg total) by mouth daily.   ??? atorvastatin (LIPITOR) 10 MG tablet Take 1 tablet (10 mg total) by mouth daily.   ??? bictegrav-emtricit-tenofov ala (BIKTARVY) 50-200-25 mg tablet Take 1 tablet by mouth daily.   ??? valACYclovir (VALTREX) 1000 MG tablet Take 1 tablet (1,000 mg total) by mouth daily.     No current facility-administered medications on file prior to visit.     Allergies  Sulfa (sulfonamide antibiotics)    FH:   Family History   Problem Relation Age of Onset   ??? Diabetes Mother    ??? Hypertension Mother    ??? Cancer Maternal Grandfather    ??? Cancer Father        Review of Systems:  The balance of systems were negative except as noted in HPI.       Objective:     Physical Exam:  There were no vitals taken for this visit. .  There is no height or weight on file to calculate BMI.  General: Well-appearing,  Well-groomed, pleasant, NAD, speech fluent   Eyes: PERRL. Extraocular muscles intact, sclera clear  ENT:  oropharynx without lesions or exudate.   Neck: Supple  Lymph Nodes: No adenopathy (cervical, axillary)   Cardiovascular: RRR, S1 and S2 normal. No murmur, rub, or gallop.   Lungs: Clear to auscultation bilaterally without wheezes/crackles/rhonchi. Good air movement.   Skin: No rashes or lesions noted on limited exam.   Abdomen:  abdomen soft, nontender, and nondistended  Extremities: No cyanosis, clubbing or edema.     Medical Decision Making & Complexity:  CD4% (T Helper)   Date Value Ref Range Status   02/09/2020 27 (L) 34 - 58 % Final   10/07/2018 25 (L) 34 - 58 % Final   12/02/2016 22 (L) 34 - 58 % Final   11/21/2015 19 (L) 34 - 58 % Final   05/31/2014 16 (L) 34 - 58 % Final   01/02/2014 10 (L) 34 - 58 % Final   07/21/2013 12 (L) 34 - 58 % Final     HIV RNA   Date Value Ref Range Status   05/23/2020 <40 (H) <0 copies/mL Final   04/02/2018 <40 (H) <0 copies/mL Final   01/28/2017 <40 (H) <0 copies/mL Final   12/02/2016 <40 (H) <0 copies/mL Final   05/31/2014 <40 /mL Final   01/02/2014 270 /mL Final   07/21/2013 91916 /mL Final     HIV RNA Log(10)   Date Value Ref Range Status   05/23/2020   Final Comment:     <1.6 log   04/02/2018   Final     Comment:     <1.6 log   01/28/2017  <0.00 log copies/mL Final     Comment:     <1.6 log   12/02/2016  <0.00 log copies/mL Final     Comment:     <1.6 log   05/31/2014 <1.60 /mL Final   01/02/2014 2.43 /mL Final   07/21/2013 4.96 /mL Final      Lab Results   Component Value Date    WBC 9.4 07/25/2020    HGB 15.5 07/25/2020    Platelet 312 07/25/2020    Creatinine 1.11 07/25/2020    AST 20 07/25/2020    ALT 21 07/25/2020    C. Trach Source THROAT 07/03/2014    C. Trach Source RECTAL 07/03/2014    C. Trach Source RANDOM URINE 07/03/2014    GC Source THROAT 07/03/2014    GC Source RECTAL 07/03/2014    GC Source RANDOM URINE 07/03/2014    Triglycerides 186 (H) 07/25/2020    HDL 31 (L) 07/25/2020    LDL Calculated 104 (H) 07/25/2020       Assessment & Plan:     HIV:  Clinically is doing well. Adherence suboptimal and at risk for lapse if HMAP not renewed.   - Check routine labs.   - Continue Biktarvy. Refills at [employer].   - To see benefits today.   - Adherence counseling today     Sexual health:   - GC/CT swabs neg 05/2020  - Syphilis screen neg 07/2020   - HCV ab  NR 07/2020  - Monkeypox counseling/offer vaccine     Essential HTN: normotensive today   - Continue amlopdine   - Advised on ambulatory BP monitoring. He will send reading via mychart.        History of depression: stable on Wellbutrin     Tobacco use:  Contemplative   - Restart Wellbutrin to 2XD  - He is aware of smoking cessation resources in clinic.  Pre-diabetes and dyslipidemia:    - A1c=6.4 07/2020 , will recheck. Consider starting Metformin    - Continue Atorvastatin 10mg  1XD.   - Discussed lifestyle modifications. There is no height or weight on file to calculate BMI.  The 10-year ASCVD risk score Denman George DC Montez Hageman., et al., 2013) is: 18.4%    Values used to calculate the score:      Age: 42 years      Sex: Male      Is Non-Hispanic African American: Yes      Diabetic: No      Tobacco smoker: Yes Systolic Blood Pressure: 135 mmHg      Is BP treated: Yes      HDL Cholesterol: 31 mg/dL      Total Cholesterol: 168 mg/dL    Health Maintenance:  - Anal pap ASCUS 11/2016; negative 03/2018 - Discussed anal cytology screening.   - HBV ab surf neg 07/2020 - unclear if he completed Hepb-cpg series   - TB screening - IGRA neg 12/2013   - Dental - referred previously  - Optho - referred previously   - Colonoscopy discussed and referred.  - Flu shot due   - COVID bi booster  - Shingrix - discussed and provided info. He plans to review this.     Immunization History   Administered Date(s) Administered Comments   ??? COVID-19 VACC,MRNA,(PFIZER)(PF)(IM) 07/12/2019    ???  08/13/2019    ???  04/19/2020    ??? HEPB-CPG,ADULT DOSAGE ADJUVANTED, IM 04/02/2018    ??? Hep A / Hep B 12/02/2016    ??? MENINGOCOCCAL VACCINE, A,C,Y, W-135(IM)(MENVEO) 04/02/2018    ??? PNEUMOCOCCAL POLYSACCHARIDE 23 12/02/2016    ??? Pneumococcal Conjugate 13-Valent 07/27/2013    ??? TdaP 07/27/2013         Disposition  Return to clinic in 3-4 months or sooner if needed.

## 2021-01-23 NOTE — Unmapped (Incomplete)
It was great to see you today.    The ID clinic phone number is [phone].  The ID clinic fax number is [phone].    Please note that your laboratory and other results may be visible to you in real time, possibly before they reach your provider. Please allow 48 hours for clinical interpretation of these results. Importantly, even if a result is flagged as abnormal, it may not be one that impacts your health.    For urgent issues on nights and weekends you may reach the ID Physician on call through the [facility] Operator at [phone].     URGENT CARE  Please call ahead to speak with the nursing staff if you are in need of an urgent appointment.       MEDICATIONS  For refills please contact your pharmacy and ask them to electronically send or fax the request to the clinic.   Please bring all medications in original bottles to every appointment.    HMAP (formerly ADAP) or [employer] Eligibility (required even if you do not receive medication through [facility])  Please remember to renew your Juanell Fairly eligibility during renewal periods which occur twice a year: January-March and July-September.     The following are needed for each renewal:   - [facility] Identification (if you don't have one, then a bill with your name and address in [place])   - proof of income (award letter, W-2, or last three check stubs)   If you are unable to come in for renewal, let us know if we can mail, fax or e-mail paperwork to you.   HMAP Contact: [phone].     Yvonna Alanis, MD  [facility] at [facility]  [address]   [place], [place] [address]  Phone: [phone]   Fax: [phone]     Lab info:  Your most recent CD4 T-cell counts and viral loads are below. Here are a few things to keep in mind when looking at your numbers:  Our goal is to get your virus to be undetectable and keep it undetectable. If the virus is undetectable you are much more likely to stay healthy.  We consider your viral load to be undetectable if it says <40 or if it says Not detected.  For most people, we're checking CD4 counts every other visit (once or twice a year, or sometimes even less).  It's normal for your CD4 count to be different from visit to visit.   You can help by taking your medications at about the same time, every single day. If you're having trouble with taking your medications, it's important to let us know.    Lab Results   Component Value Date    ACD4 1,080 02/09/2020    CD4 27 (L) 02/09/2020    HIVCP <40 (H) 05/23/2020    HIVRS Detected (A) 05/23/2020

## 2021-01-30 NOTE — Unmapped (Signed)
Pulmonary function testing was performed as of the MACS/WIHS Combined Cohort Study (21-0857), with results attached in media tab. Clinical read will be provided below by study pulmonologist.

## 2021-01-30 NOTE — Unmapped (Signed)
Greg Daniel is a 50 y.o. male here for a SHORT MWCCS Study Visit (IRB [phone]) on 01/29/2021. HIV positive participant. Denies complaints today.     Negative COVID-19 Screen: Wellness screening and temperature check conducted at clinic door. Participant reported no COVID-19 symptoms, no contact to a known or suspected COVID-19 case in the last 14 days, and temperature was within normal limits. Participant was cleared to enter the facility.     Study visit included: Interview   Labs Collected: blood specimens for PFT

## 2021-01-30 NOTE — Unmapped (Signed)
PFT Interpretation    01/30/2021  Greg Daniel (08/16/1970)     This assessment is performed as part of a research protocol and therefore is not part of the patient???s routine medical care. Spirometry and diffusing lung capacity (DLco) results interpretation have been provided by study physician and pulmonologist Dr. Andee Lineman, with his interpretation below.    Spirometry testing:  Quality: Valid for interpretation  Interpretation: Normal spirometry. No obstruction. There is no bronchodilator response.    Diffusion capacity testing:  Quality: Valid for interpretation  Interpretation: Mild diffusion impairment (65% predicted to LLN)    Because these tests were performed in the context of research, they should not be used to guide clinical care. If abnormalities are present, the treating physician should consider a clinical evaluation to determine if formal pulmonary function testing is required. For inquiries regarding these results, please contact M. Billy Coast via EPIC or at [email or web address]        Janelle Floor, MD [facility]  CC: Glo Herring, Rossie Muskrat (all reports)  CC: Dr. Eldridge Dace (abnormal results)

## 2021-01-31 ENCOUNTER — Institutional Professional Consult (permissible substitution): Admit: 2021-01-31 | Discharge: 2021-02-01

## 2021-02-01 LAB — CARBOXYHEMOGLOBIN: CARBON MONOXIDE, BLOOD: 13.4 % — ABNORMAL HIGH (ref 0.0–3.6)

## 2021-02-01 LAB — HEMOGLOBIN: HEMOGLOBIN: 15.3 g/dL (ref 13.0–17.7)

## 2021-02-07 NOTE — Unmapped (Signed)
RD called pt to remind them of group pre-diabetes counseling 02/08/2021 at 9am. RD shared webex link with pt via [employer] as another method of communication.     Unk Pinto, MS, RD, LDN

## 2021-02-28 NOTE — Unmapped (Signed)
Specialty Medication(s): Biktarvy 50-200-25mg     Greg Daniel has been dis-enrolled from the [facility] specialty pharmacy services due to multiple unsuccessful outreach attempts by the pharmacy.    Additional information provided to the patient: n/a    Roderic Palau  [facility] Specialty Pharmacist

## 2021-05-10 ENCOUNTER — Institutional Professional Consult (permissible substitution): Admit: 2021-05-10 | Discharge: 2021-05-11

## 2021-05-10 ENCOUNTER — Ambulatory Visit: Admit: 2021-05-10 | Discharge: 2021-05-11 | Attending: Family | Primary: Family

## 2021-05-10 DIAGNOSIS — B20 Human immunodeficiency virus [HIV] disease: Principal | ICD-10-CM

## 2021-05-10 DIAGNOSIS — I10 Essential (primary) hypertension: Principal | ICD-10-CM

## 2021-05-10 DIAGNOSIS — A6000 Herpesviral infection of urogenital system, unspecified: Principal | ICD-10-CM

## 2021-05-10 DIAGNOSIS — Z006 Encounter for examination for normal comparison and control in clinical research program: Principal | ICD-10-CM

## 2021-05-10 DIAGNOSIS — E785 Hyperlipidemia, unspecified: Principal | ICD-10-CM

## 2021-05-10 MED ORDER — AMLODIPINE 5 MG TABLET
ORAL_TABLET | Freq: Every day | ORAL | 5 refills | 30 days | Status: CP
Start: 2021-05-10 — End: ?

## 2021-05-10 MED ORDER — BIKTARVY 50 MG-200 MG-25 MG TABLET
ORAL_TABLET | Freq: Every day | ORAL | 5 refills | 30 days | Status: CP
Start: 2021-05-10 — End: ?

## 2021-05-10 MED ORDER — VALACYCLOVIR 1 GRAM TABLET
ORAL_TABLET | Freq: Every day | ORAL | 5 refills | 30.00000 days | Status: CP
Start: 2021-05-10 — End: ?

## 2021-05-10 MED ORDER — ATORVASTATIN 10 MG TABLET
ORAL_TABLET | Freq: Every day | ORAL | 5 refills | 30 days | Status: CP
Start: 2021-05-10 — End: ?

## 2021-05-20 ENCOUNTER — Ambulatory Visit: Admit: 2021-05-20 | Discharge: 2021-05-21

## 2021-05-27 DIAGNOSIS — J8482 Adult pulmonary Langerhans cell histiocytosis: Principal | ICD-10-CM

## 2021-07-29 ENCOUNTER — Ambulatory Visit: Admit: 2021-07-29

## 2021-07-31 ENCOUNTER — Ambulatory Visit: Admit: 2021-07-31

## 2021-08-09 DIAGNOSIS — I10 Essential (primary) hypertension: Principal | ICD-10-CM

## 2021-08-09 DIAGNOSIS — E785 Hyperlipidemia, unspecified: Principal | ICD-10-CM

## 2021-08-09 DIAGNOSIS — B20 Human immunodeficiency virus [HIV] disease: Principal | ICD-10-CM

## 2021-08-09 MED ORDER — BIKTARVY 50 MG-200 MG-25 MG TABLET
ORAL_TABLET | Freq: Every day | ORAL | 0 refills | 30 days | Status: CP
Start: 2021-08-09 — End: 2021-09-08
  Filled 2021-08-09: qty 30, 30d supply, fill #0

## 2021-08-09 MED ORDER — ATORVASTATIN 10 MG TABLET
ORAL_TABLET | Freq: Every day | ORAL | 0 refills | 30 days | Status: CP
Start: 2021-08-09 — End: 2021-09-08
  Filled 2021-08-09: qty 30, 30d supply, fill #0

## 2021-08-09 MED ORDER — AMLODIPINE 5 MG TABLET
ORAL_TABLET | Freq: Every day | ORAL | 0 refills | 30 days | Status: CP
Start: 2021-08-09 — End: 2021-09-08
  Filled 2021-08-09: qty 30, 30d supply, fill #0

## 2021-08-09 MED ORDER — VALACYCLOVIR 1 GRAM TABLET
ORAL_TABLET | Freq: Every day | ORAL | 0 refills | 30 days | Status: CP
Start: 2021-08-09 — End: 2021-09-08
  Filled 2021-08-09: qty 30, 30d supply, fill #0

## 2021-08-15 ENCOUNTER — Ambulatory Visit: Admit: 2021-08-15 | Discharge: 2021-08-16

## 2021-08-26 ENCOUNTER — Ambulatory Visit: Admit: 2021-08-26

## 2021-08-28 ENCOUNTER — Ambulatory Visit: Admit: 2021-08-28 | Discharge: 2021-08-28

## 2021-08-28 DIAGNOSIS — R918 Other nonspecific abnormal finding of lung field: Principal | ICD-10-CM

## 2021-08-28 DIAGNOSIS — B20 Human immunodeficiency virus [HIV] disease: Principal | ICD-10-CM

## 2021-08-28 DIAGNOSIS — Z720 Tobacco use: Principal | ICD-10-CM

## 2021-08-28 DIAGNOSIS — R590 Localized enlarged lymph nodes: Principal | ICD-10-CM

## 2021-08-28 DIAGNOSIS — E785 Hyperlipidemia, unspecified: Principal | ICD-10-CM

## 2021-08-28 DIAGNOSIS — R7303 Prediabetes: Principal | ICD-10-CM

## 2021-09-23 ENCOUNTER — Ambulatory Visit: Admit: 2021-09-23 | Discharge: 2021-09-23

## 2021-09-23 ENCOUNTER — Institutional Professional Consult (permissible substitution): Admit: 2021-09-23 | Discharge: 2021-09-23

## 2021-09-23 DIAGNOSIS — Z006 Encounter for examination for normal comparison and control in clinical research program: Principal | ICD-10-CM

## 2021-10-14 ENCOUNTER — Ambulatory Visit: Admit: 2021-10-14

## 2021-10-14 ENCOUNTER — Ambulatory Visit: Admit: 2021-10-14 | Attending: Otolaryngology | Primary: Otolaryngology

## 2021-11-26 DIAGNOSIS — B20 Human immunodeficiency virus [HIV] disease: Principal | ICD-10-CM

## 2021-11-26 MED ORDER — BICTEGRAVIR 50 MG-EMTRICITABINE 200 MG-TENOFOVIR ALAFENAM 25 MG TABLET
ORAL_TABLET | Freq: Every day | ORAL | 3 refills | 30 days | Status: CP
Start: 2021-11-26 — End: ?
  Filled 2021-11-28: qty 30, 30d supply, fill #0

## 2021-12-24 ENCOUNTER — Ambulatory Visit: Admit: 2021-12-24 | Discharge: 2021-12-25 | Attending: Otolaryngology | Primary: Otolaryngology

## 2021-12-24 DIAGNOSIS — Z720 Tobacco use: Principal | ICD-10-CM

## 2021-12-24 DIAGNOSIS — R590 Localized enlarged lymph nodes: Principal | ICD-10-CM

## 2022-01-06 DIAGNOSIS — Z720 Tobacco use: Principal | ICD-10-CM

## 2022-01-06 MED ORDER — BUPROPION HCL 150 MG TABLET,12 HR SUSTAINED-RELEASE(SMOKING DETERRENT)
ORAL_TABLET | Freq: Two times a day (BID) | ORAL | 3 refills | 30.00000 days | Status: CP
Start: 2022-01-06 — End: 2022-05-06

## 2022-01-14 DIAGNOSIS — E785 Hyperlipidemia, unspecified: Principal | ICD-10-CM

## 2022-01-14 DIAGNOSIS — I10 Essential (primary) hypertension: Principal | ICD-10-CM

## 2022-01-14 MED ORDER — AMLODIPINE 5 MG TABLET
ORAL_TABLET | Freq: Every day | ORAL | 0 refills | 30.00000 days | Status: CN
Start: 2022-01-14 — End: 2022-02-13

## 2022-01-14 MED ORDER — ATORVASTATIN 10 MG TABLET
ORAL_TABLET | Freq: Every day | ORAL | 0 refills | 30 days | Status: CN
Start: 2022-01-14 — End: 2022-02-13

## 2022-01-16 DIAGNOSIS — I10 Essential (primary) hypertension: Principal | ICD-10-CM

## 2022-01-16 DIAGNOSIS — E785 Hyperlipidemia, unspecified: Principal | ICD-10-CM

## 2022-01-16 MED ORDER — AMLODIPINE 5 MG TABLET
ORAL_TABLET | Freq: Every day | ORAL | 0 refills | 30 days | Status: CP
Start: 2022-01-16 — End: 2022-02-15
  Filled 2022-01-17: qty 30, 30d supply, fill #0

## 2022-01-16 MED ORDER — ATORVASTATIN 10 MG TABLET
ORAL_TABLET | Freq: Every day | ORAL | 0 refills | 30 days | Status: CP
Start: 2022-01-16 — End: 2022-02-15
  Filled 2022-01-17: qty 30, 30d supply, fill #0

## 2022-01-17 MED FILL — BIKTARVY 50 MG-200 MG-25 MG TABLET: ORAL | 30 days supply | Qty: 30 | Fill #1

## 2022-02-10 DIAGNOSIS — I10 Essential (primary) hypertension: Principal | ICD-10-CM

## 2022-02-10 DIAGNOSIS — E785 Hyperlipidemia, unspecified: Principal | ICD-10-CM

## 2022-02-10 MED ORDER — ATORVASTATIN 10 MG TABLET
ORAL_TABLET | Freq: Every day | ORAL | 0 refills | 30 days | Status: CP
Start: 2022-02-10 — End: 2022-03-12
  Filled 2022-02-13: qty 30, 30d supply, fill #0

## 2022-02-10 MED ORDER — AMLODIPINE 5 MG TABLET
ORAL_TABLET | Freq: Every day | ORAL | 0 refills | 30 days | Status: CP
Start: 2022-02-10 — End: 2022-03-12
  Filled 2022-02-13: qty 30, 30d supply, fill #0

## 2022-02-13 MED FILL — BIKTARVY 50 MG-200 MG-25 MG TABLET: ORAL | 30 days supply | Qty: 30 | Fill #2

## 2022-03-05 DIAGNOSIS — B20 Human immunodeficiency virus [HIV] disease: Principal | ICD-10-CM

## 2022-03-05 DIAGNOSIS — I10 Essential (primary) hypertension: Principal | ICD-10-CM

## 2022-03-05 DIAGNOSIS — E785 Hyperlipidemia, unspecified: Principal | ICD-10-CM

## 2022-03-05 MED ORDER — ATORVASTATIN 10 MG TABLET
ORAL_TABLET | Freq: Every day | ORAL | 0 refills | 30 days
Start: 2022-03-05 — End: 2022-04-04

## 2022-03-05 MED ORDER — AMLODIPINE 5 MG TABLET
ORAL_TABLET | Freq: Every day | ORAL | 0 refills | 30 days
Start: 2022-03-05 — End: 2022-04-04

## 2022-03-05 MED ORDER — VALACYCLOVIR 1 GRAM TABLET
ORAL_TABLET | Freq: Every day | ORAL | 0 refills | 30 days
Start: 2022-03-05 — End: 2022-04-04

## 2022-03-10 MED ORDER — VALACYCLOVIR 1 GRAM TABLET
ORAL_TABLET | Freq: Every day | ORAL | 0 refills | 30 days | Status: CP
Start: 2022-03-10 — End: 2022-04-09
  Filled 2022-03-11: qty 30, 30d supply, fill #0

## 2022-03-10 MED ORDER — ATORVASTATIN 10 MG TABLET
ORAL_TABLET | Freq: Every day | ORAL | 0 refills | 30 days | Status: CP
Start: 2022-03-10 — End: 2022-04-09
  Filled 2022-03-11: qty 30, 30d supply, fill #0

## 2022-03-10 MED ORDER — AMLODIPINE 5 MG TABLET
ORAL_TABLET | Freq: Every day | ORAL | 0 refills | 30 days | Status: CP
Start: 2022-03-10 — End: 2022-04-09
  Filled 2022-03-11: qty 30, 30d supply, fill #0

## 2022-03-11 MED FILL — BIKTARVY 50 MG-200 MG-25 MG TABLET: ORAL | 30 days supply | Qty: 30 | Fill #3

## 2022-03-19 ENCOUNTER — Ambulatory Visit: Admit: 2022-03-19

## 2022-03-31 ENCOUNTER — Emergency Department: Payer: Self-pay

## 2022-03-31 ENCOUNTER — Emergency Department
Admission: EM | Admit: 2022-03-31 | Discharge: 2022-03-31 | Disposition: A | Discharge status: Home / Self Care | Payer: Self-pay | Attending: Emergency Medicine | Admitting: Emergency Medicine

## 2022-03-31 ENCOUNTER — Other Ambulatory Visit: Payer: Self-pay

## 2022-03-31 DIAGNOSIS — Z21 Asymptomatic human immunodeficiency virus [HIV] infection status: Secondary | ICD-10-CM | POA: Insufficient documentation

## 2022-03-31 DIAGNOSIS — Z1152 Encounter for screening for COVID-19: Secondary | ICD-10-CM | POA: Insufficient documentation

## 2022-03-31 DIAGNOSIS — J101 Influenza due to other identified influenza virus with other respiratory manifestations: Secondary | ICD-10-CM | POA: Insufficient documentation

## 2022-03-31 DIAGNOSIS — I10 Essential (primary) hypertension: Secondary | ICD-10-CM | POA: Insufficient documentation

## 2022-03-31 LAB — RESP PANEL BY RT-PCR (RSV, FLU A&B, COVID)  RVPGX2
Influenza A by PCR: POSITIVE — AB
Influenza B by PCR: NEGATIVE
Resp Syncytial Virus by PCR: NEGATIVE
SARS Coronavirus 2 by RT PCR: NEGATIVE

## 2022-03-31 MED ORDER — IBUPROFEN 800 MG PO TABS
800.0000 mg | ORAL_TABLET | Freq: Once | ORAL | Status: AC
  Administered 2022-03-31: 800 mg via ORAL
  Filled 2022-03-31: qty 1

## 2022-03-31 NOTE — ED Provider Notes (Signed)
[facility] Provider Note    Event Date/Time   First MD Initiated Contact with Patient 03/31/22 [phone]     (approximate)   History   bodyaches and Chills   HPI  Nathan Ray is a 51 y.o. male with history of HIV, hypertension, hyperlipidemia who presents to the emergency department 3 days of cough, congestion, body aches, fever and chills.  He has not been vaccinated for the flu.  Negative viral load and CD4 count of 1296 in May 2023.   History provided by patient.    No past medical history on file.  No past surgical history on file.  MEDICATIONS:  Prior to Admission medications   Not on File    Physical Exam   Triage Vital Signs: ED Triage Vitals  Enc Vitals Group     BP 03/31/22 0158 (!) 154/96     Pulse Rate 03/31/22 0158 95     Resp 03/31/22 0158 20     Temp 03/31/22 0158 98.8 F (37.1 C)     Temp Source 03/31/22 0158 Oral     SpO2 03/31/22 0158 96 %     Weight 03/31/22 0158 158 lb (71.7 kg)     Height 03/31/22 0158 5\' 5"  (1.651 m)     Head Circumference --      Peak Flow --      Pain Score 03/31/22 0206 0     Pain Loc --      Pain Edu? --      Excl. in GC? --     Most recent vital signs: Vitals:   03/31/22 0158 03/31/22 0439  BP: (!) 154/96 (!) 152/85  Pulse: 95 96  Resp: 20 19  Temp: 98.8 F (37.1 C) 98.3 F (36.8 C)  SpO2: 96% 94%    CONSTITUTIONAL: Alert and oriented and responds appropriately to questions. Well-appearing; well-nourished HEAD: Normocephalic, atraumatic EYES: Conjunctivae clear, pupils appear equal, sclera nonicteric ENT: normal nose; moist mucous membranes NECK: Supple, normal ROM CARD: RRR; S1 and S2 appreciated; no murmurs, no clicks, no rubs, no gallops RESP: Normal chest excursion without splinting or tachypnea; breath sounds clear and equal bilaterally; no wheezes, no rhonchi, no rales, no hypoxia or respiratory distress, speaking full sentences ABD/GI: Normal bowel sounds;  non-distended; soft, non-tender, no rebound, no guarding, no peritoneal signs BACK: The back appears normal EXT: Normal ROM in all joints; no deformity noted, no edema; no cyanosis SKIN: Normal color for age and race; warm; no rash on exposed skin NEURO: Moves all extremities equally, normal speech PSYCH: The patient's mood and manner are appropriate.   ED Results / Procedures / Treatments   LABS: (all labs ordered are listed, but only abnormal results are displayed) Labs Reviewed  RESP PANEL BY RT-PCR (RSV, FLU A&B, COVID)  RVPGX2 - Abnormal; Notable for the following components:      Result Value   Influenza A by PCR POSITIVE (*)    All other components within normal limits     EKG:  RADIOLOGY: My personal review and interpretation of imaging: X-ray clear.  I have personally reviewed all radiology reports.   DG Chest 2 View  Result Date: 03/31/2022 CLINICAL DATA:  cough EXAM: CHEST - 2 VIEW COMPARISON:  None Available. FINDINGS: The heart and mediastinal contours are within normal limits. No focal consolidation. No pulmonary edema. No pleural effusion. No pneumothorax. No acute osseous abnormality. IMPRESSION: No active cardiopulmonary disease. Electronically Signed   By: 04/02/2022.D.  On: 03/31/2022 03:34     PROCEDURES:  Critical Care performed: No    Procedures    IMPRESSION / MDM / ASSESSMENT AND PLAN / ED COURSE  I reviewed the triage vital signs and the nursing notes.    Patient here with flulike symptoms.    DIFFERENTIAL DIAGNOSIS (includes but not limited to):   2, COVID, RSV, other viral URI, pneumonia   Patient's presentation is most consistent with acute complicated illness / injury requiring diagnostic workup.   PLAN: COVID and RSV negative.  Patient is positive for influenza A.  Chest x-ray reviewed and interpreted by myself and the radiologist and shows no acute abnormality.  He is nontoxic-appearing, well-hydrated.  No hypoxia or  respiratory distress.  No increased work of breathing.  Outside treatment window for antivirals.  No indication for antibiotics today.  Discussed supportive care instructions and return precautions.  Does have history of HIV but on antivirals.  Recent lab work by [facility] infectious disease showed negative viral load and normal CD4 count.   MEDICATIONS GIVEN IN ED: Medications  ibuprofen (ADVIL) tablet 800 mg (800 mg Oral Given 03/31/22 0518)     ED COURSE:  At this time, I do not feel there is any life-threatening condition present. I reviewed all nursing notes, vitals, pertinent previous records.  All lab and urine results, EKGs, imaging ordered have been independently reviewed and interpreted by myself.  I reviewed all available radiology reports from any imaging ordered this visit.  Based on my assessment, I feel the patient is safe to be discharged home without further emergent workup and can continue workup as an outpatient as needed. Discussed all findings, treatment plan as well as usual and customary return precautions.  They verbalize understanding and are comfortable with this plan.  Outpatient follow-up has been provided as needed.  All questions have been answered.    CONSULTS:  none   OUTSIDE RECORDS REVIEWED: Reviewed patient's recent infectious disease notes from [facility].       FINAL CLINICAL IMPRESSION(S) / ED DIAGNOSES   Final diagnoses:  Influenza A     Rx / DC Orders   ED Discharge Orders     None        Note:  This document was prepared using Dragon voice recognition software and may include unintentional dictation errors.   Jguadalupe Opiela, Layla Maw, DO 03/31/22 [phone]

## 2022-03-31 NOTE — Discharge Instructions (Addendum)
You may alternate Tylenol 1000 mg every 6 hours as needed for fever and pain and ibuprofen 800 mg every 6-8 hours as needed for fever and pain. Please rest and drink plenty of fluids. This is a viral illness causing your symptoms. You do not need antibiotics for a virus. You may use over-the-counter nasal saline spray and Afrin nasal saline spray as needed for nasal congestion. Please do not use Afrin for more than 3 days in a row. You may use guaifenesin and dextromethorphan as needed for cough.  You may use lozenges and Chloraseptic spray to help with sore throat.  Warm salt water gargles can also help with sore throat.  You may use over-the-counter Unisom (doxyalamine) or Benadryl (diphenhydramine) to help with sleep.  Please note that some combination medicines such as DayQuil and NyQuil have multiple medications in them.  Please make sure you look at all labels to ensure that you are not taking too much of any one particular medication.  Symptoms from a virus may take 7-14 days to run its course.  The flu is treated like any other virus with supportive measures as listed above. At this time you are outside the treatment window for Tamiflu or Xofluza. These medications have to be taken within the first 48 hours of symptoms.  Tamiflu has many side effects including nausea, vomiting and diarrhea.  

## 2022-03-31 NOTE — ED Triage Notes (Signed)
Pt to ED from home for bodyaches, chills, runny nose and cough. Pt is CAOx4 and in no acute distress. Pt has been sick x 2 days,

## 2022-03-31 NOTE — ED Notes (Signed)
Covid swab sent to the lab at this time.  

## 2022-04-04 DIAGNOSIS — I10 Essential (primary) hypertension: Principal | ICD-10-CM

## 2022-04-04 DIAGNOSIS — E785 Hyperlipidemia, unspecified: Principal | ICD-10-CM

## 2022-04-04 DIAGNOSIS — B20 Human immunodeficiency virus [HIV] disease: Principal | ICD-10-CM

## 2022-04-04 MED ORDER — VALACYCLOVIR 1 GRAM TABLET
ORAL_TABLET | Freq: Every day | ORAL | 0 refills | 30 days
Start: 2022-04-04 — End: 2022-05-04

## 2022-04-04 MED ORDER — BIKTARVY 50 MG-200 MG-25 MG TABLET
ORAL_TABLET | Freq: Every day | ORAL | 3 refills | 30 days
Start: 2022-04-04 — End: ?

## 2022-04-04 MED ORDER — AMLODIPINE 5 MG TABLET
ORAL_TABLET | Freq: Every day | ORAL | 0 refills | 30 days
Start: 2022-04-04 — End: 2022-05-04

## 2022-04-04 MED ORDER — ATORVASTATIN 10 MG TABLET
ORAL_TABLET | Freq: Every day | ORAL | 0 refills | 30 days
Start: 2022-04-04 — End: 2022-05-04

## 2022-04-07 ENCOUNTER — Emergency Department: Payer: Self-pay

## 2022-04-07 ENCOUNTER — Encounter: Payer: Self-pay | Admitting: Emergency Medicine

## 2022-04-07 ENCOUNTER — Emergency Department
Admission: EM | Admit: 2022-04-07 | Discharge: 2022-04-07 | Disposition: A | Discharge status: Home / Self Care | Payer: Self-pay | Attending: Emergency Medicine | Admitting: Emergency Medicine

## 2022-04-07 ENCOUNTER — Other Ambulatory Visit: Payer: Self-pay

## 2022-04-07 DIAGNOSIS — J101 Influenza due to other identified influenza virus with other respiratory manifestations: Secondary | ICD-10-CM | POA: Insufficient documentation

## 2022-04-07 DIAGNOSIS — J111 Influenza due to unidentified influenza virus with other respiratory manifestations: Secondary | ICD-10-CM

## 2022-04-07 DIAGNOSIS — I10 Essential (primary) hypertension: Secondary | ICD-10-CM | POA: Insufficient documentation

## 2022-04-07 MED ORDER — ACETAMINOPHEN 325 MG PO TABS
650.0000 mg | ORAL_TABLET | Freq: Once | ORAL | Status: AC
  Administered 2022-04-07: 650 mg via ORAL
  Filled 2022-04-07: qty 2

## 2022-04-07 NOTE — Discharge Instructions (Signed)
Continue to take Tylenol/ibuprofen per package instructions to help with your symptoms.  Please return to the emergency department for any new, worsening, or change in symptoms or other concerns.

## 2022-04-07 NOTE — ED Triage Notes (Signed)
Patient was seen here 1 week ago and diagnosed with the flu and is here today because it's not getting better; Denies fevers and is taking Tylenol as needed

## 2022-04-07 NOTE — ED Provider Notes (Signed)
[facility] Provider Note    Event Date/Time   First MD Initiated Contact with Patient 04/07/22 1342     (approximate)   History   Influenza (Patient was seen here 1 week ago and diagnosed with the flu and is here today because it's not getting better; Denies fevers and is taking Tylenol as needed)   HPI  Nathan Ray is a 51 y.o. male with a past medical history of hypertension, hyperlipidemia, HIV who presents today for evaluation of cough, congestion, body aches since 1 week ago.  He came to the emergency department at that time and was diagnosed with the flu.  He reports that he is back because he needs another work note.  He reports that his symptoms are "much better" than they were 1 week ago but not yet completely resolved.  He reports that his cough is improved.  There are no problems to display for this patient.         Physical Exam   Triage Vital Signs: ED Triage Vitals [04/07/22 1217]  Enc Vitals Group     BP 127/88     Pulse Rate 95     Resp 16     Temp 98.3 F (36.8 C)     Temp Source Oral     SpO2 98 %     Weight 158 lb (71.7 kg)     Height 5' 4.5" (1.638 m)     Head Circumference      Peak Flow      Pain Score      Pain Loc      Pain Edu?      Excl. in GC?     Most recent vital signs: Vitals:   04/07/22 1217  BP: 127/88  Pulse: 95  Resp: 16  Temp: 98.3 F (36.8 C)  SpO2: 98%    Physical Exam Vitals and nursing note reviewed.  Constitutional:      General: Awake and alert. No acute distress.    Appearance: Normal appearance. The patient is normal weight.  HENT:     Head: Normocephalic and atraumatic.     Mouth: Mucous membranes are moist.  No intraoral/mucosal lesions Eyes:     General: PERRL. Normal EOMs        Right eye: No discharge.        Left eye: No discharge.     Conjunctiva/sclera: Conjunctivae normal.  Cardiovascular:     Rate and Rhythm: Normal rate and regular rhythm.     Pulses:  Normal pulses.     Heart sounds: Normal heart sounds Pulmonary:     Effort: Pulmonary effort is normal. No respiratory distress.     Breath sounds: Normal breath sounds.  Abdominal:     Abdomen is soft. There is no abdominal tenderness. No rebound or guarding. No distention. Musculoskeletal:        General: No swelling. Normal range of motion.     Cervical back: Normal range of motion and neck supple.  Skin:    General: Skin is warm and dry.     Capillary Refill: Capillary refill takes less than 2 seconds.     Findings: No rash.  Neurological:     Mental Status: The patient is awake and alert.      ED Results / Procedures / Treatments   Labs (all labs ordered are listed, but only abnormal results are displayed) Labs Reviewed - No data to display   EKG  RADIOLOGY I independently reviewed and interpreted imaging and agree with radiologists findings.     PROCEDURES:  Critical Care performed:   Procedures   MEDICATIONS ORDERED IN ED: Medications  acetaminophen (TYLENOL) tablet 650 mg (650 mg Oral Given 04/07/22 1405)     IMPRESSION / MDM / ASSESSMENT AND PLAN / ED COURSE  I reviewed the triage vital signs and the nursing notes.   Differential diagnosis includes, but is not limited to, influenza, COVID, RSV, pneumonia, bronchitis.  I reviewed the patient's chart.  Patient was diagnosed with influenza A on 03/31/2022.  He has no hypoxia or respiratory distress.  He demonstrates no increased work of breathing.  He is compliant with his antiretroviral therapy.  X-ray obtained at previous visit was negative for pneumonia.  He reports that his symptoms are significantly improved since last week.  He has mild prominence of his interstitial markings on his x-ray, however his cough is not any worse, he does not have a fever, his symptoms are significantly improved, I do not think that this is representing an acute or developing pneumonia at this time.  He was treated  symptomatically with Tylenol.  He requested another work which was provided.  No chest pain or fever to suggest myocarditis.  I recommend close outpatient follow-up and strict return precautions.  Patient understands and agrees with plan.  He was discharged in stable condition.  His work note was provided for him.   Patient's presentation is most consistent with acute complicated illness / injury requiring diagnostic workup.       FINAL CLINICAL IMPRESSION(S) / ED DIAGNOSES   Final diagnoses:  Influenza     Rx / DC Orders   ED Discharge Orders     None        Note:  This document was prepared using Dragon voice recognition software and may include unintentional dictation errors.   Keturah Shavers 04/07/22 1503    Georga Hacking, MD 04/08/22 [phone]

## 2022-05-08 DIAGNOSIS — E785 Hyperlipidemia, unspecified: Principal | ICD-10-CM

## 2022-05-08 DIAGNOSIS — I10 Essential (primary) hypertension: Principal | ICD-10-CM

## 2022-05-08 DIAGNOSIS — B20 Human immunodeficiency virus [HIV] disease: Principal | ICD-10-CM

## 2022-05-08 MED ORDER — BIKTARVY 50 MG-200 MG-25 MG TABLET
ORAL_TABLET | Freq: Every day | ORAL | 3 refills | 30 days | Status: CP
Start: 2022-05-08 — End: ?
  Filled 2022-06-13: qty 30, 30d supply, fill #0

## 2022-05-08 MED ORDER — AMLODIPINE 5 MG TABLET
ORAL_TABLET | Freq: Every day | ORAL | 0 refills | 30 days | Status: CP
Start: 2022-05-08 — End: 2022-06-07
  Filled 2022-06-13: qty 30, 30d supply, fill #0

## 2022-05-08 MED ORDER — VALACYCLOVIR 1 GRAM TABLET
ORAL_TABLET | Freq: Every day | ORAL | 0 refills | 30 days | Status: CP
Start: 2022-05-08 — End: 2022-06-07
  Filled 2022-06-13: qty 30, 30d supply, fill #0

## 2022-05-08 MED ORDER — ATORVASTATIN 10 MG TABLET
ORAL_TABLET | Freq: Every day | ORAL | 0 refills | 30 days | Status: CP
Start: 2022-05-08 — End: 2022-06-07
  Filled 2022-06-13: qty 30, 30d supply, fill #0

## 2022-05-14 DIAGNOSIS — Z006 Encounter for examination for normal comparison and control in clinical research program: Principal | ICD-10-CM

## 2022-07-14 DIAGNOSIS — B20 Human immunodeficiency virus [HIV] disease: Principal | ICD-10-CM

## 2022-07-14 DIAGNOSIS — I10 Essential (primary) hypertension: Principal | ICD-10-CM

## 2022-07-14 DIAGNOSIS — E785 Hyperlipidemia, unspecified: Principal | ICD-10-CM

## 2022-07-14 MED ORDER — AMLODIPINE 5 MG TABLET
ORAL_TABLET | Freq: Every day | ORAL | 2 refills | 30 days
Start: 2022-07-14 — End: 2022-08-13

## 2022-07-14 MED ORDER — ATORVASTATIN 10 MG TABLET
ORAL_TABLET | Freq: Every day | ORAL | 2 refills | 30 days
Start: 2022-07-14 — End: 2022-08-13

## 2022-07-14 MED ORDER — VALACYCLOVIR 1 GRAM TABLET
ORAL_TABLET | Freq: Every day | ORAL | 2 refills | 30 days
Start: 2022-07-14 — End: 2022-08-13

## 2022-07-16 DIAGNOSIS — I10 Essential (primary) hypertension: Principal | ICD-10-CM

## 2022-07-16 DIAGNOSIS — B20 Human immunodeficiency virus [HIV] disease: Principal | ICD-10-CM

## 2022-07-16 DIAGNOSIS — E785 Hyperlipidemia, unspecified: Principal | ICD-10-CM

## 2022-07-16 MED ORDER — ATORVASTATIN 10 MG TABLET
ORAL_TABLET | Freq: Every day | ORAL | 0 refills | 30 days | Status: CP
Start: 2022-07-16 — End: 2022-08-15
  Filled 2022-07-17: qty 30, 30d supply, fill #0

## 2022-07-16 MED ORDER — AMLODIPINE 5 MG TABLET
ORAL_TABLET | Freq: Every day | ORAL | 0 refills | 30 days | Status: CP
Start: 2022-07-16 — End: 2022-08-15
  Filled 2022-07-17: qty 30, 30d supply, fill #0

## 2022-07-16 MED ORDER — VALACYCLOVIR 1 GRAM TABLET
ORAL_TABLET | Freq: Every day | ORAL | 0 refills | 30 days | Status: CP
Start: 2022-07-16 — End: 2022-08-15
  Filled 2022-07-17: qty 30, 30d supply, fill #0

## 2022-07-17 MED FILL — BIKTARVY 50 MG-200 MG-25 MG TABLET: ORAL | 30 days supply | Qty: 30 | Fill #1

## 2022-08-20 ENCOUNTER — Ambulatory Visit: Admit: 2022-08-20

## 2022-10-01 ENCOUNTER — Ambulatory Visit: Admit: 2022-10-01 | Discharge: 2022-10-02

## 2022-10-01 DIAGNOSIS — E785 Hyperlipidemia, unspecified: Principal | ICD-10-CM

## 2022-10-01 DIAGNOSIS — I10 Essential (primary) hypertension: Principal | ICD-10-CM

## 2022-10-01 DIAGNOSIS — Z13220 Encounter for screening for lipoid disorders: Principal | ICD-10-CM

## 2022-10-01 DIAGNOSIS — R918 Other nonspecific abnormal finding of lung field: Principal | ICD-10-CM

## 2022-10-01 DIAGNOSIS — B20 Human immunodeficiency virus [HIV] disease: Principal | ICD-10-CM

## 2022-10-01 DIAGNOSIS — R9389 Abnormal findings on diagnostic imaging of other specified body structures: Principal | ICD-10-CM

## 2022-10-01 DIAGNOSIS — Z1151 Encounter for screening for human papillomavirus (HPV): Principal | ICD-10-CM

## 2022-10-01 DIAGNOSIS — Z9189 Other specified personal risk factors, not elsewhere classified: Principal | ICD-10-CM

## 2022-10-01 DIAGNOSIS — Z79899 Other long term (current) drug therapy: Principal | ICD-10-CM

## 2022-10-01 DIAGNOSIS — Z5181 Encounter for therapeutic drug level monitoring: Principal | ICD-10-CM

## 2022-10-01 DIAGNOSIS — Z1212 Encounter for screening for malignant neoplasm of rectum: Principal | ICD-10-CM

## 2022-10-01 DIAGNOSIS — Z113 Encounter for screening for infections with a predominantly sexual mode of transmission: Principal | ICD-10-CM

## 2022-10-01 MED ORDER — BIKTARVY 50 MG-200 MG-25 MG TABLET
ORAL_TABLET | Freq: Every day | ORAL | 5 refills | 30 days | Status: CP
Start: 2022-10-01 — End: ?

## 2022-10-01 MED ORDER — ATORVASTATIN 10 MG TABLET
ORAL_TABLET | Freq: Every day | ORAL | 5 refills | 30 days | Status: CP
Start: 2022-10-01 — End: 2023-03-30

## 2022-10-01 MED ORDER — AMLODIPINE 5 MG TABLET
ORAL_TABLET | Freq: Every day | ORAL | 5 refills | 30 days | Status: CP
Start: 2022-10-01 — End: 2023-03-30

## 2022-10-20 NOTE — Unmapped (Signed)
Specialty Medication(s): Biktarvy 50-200-25mg     Mr.Couchman has been dis-enrolled from the [facility] specialty pharmacy services due to the patient now having HMAP/UMAP and must use [facility] or a HMAP approved [facility] listed in the [facility] Program Manual ([employer]dk)    I can re-enroll the patient to receive this medication in the future if needed.    Additional information provided to the patient: n/a    Roderic Palau, PharmD  [facility] Specialty Pharmacist

## 2022-10-24 ENCOUNTER — Ambulatory Visit: Admit: 2022-10-24 | Discharge: 2022-10-25

## 2023-02-04 ENCOUNTER — Ambulatory Visit: Admit: 2023-02-04 | Discharge: 2023-02-05

## 2023-02-04 DIAGNOSIS — E785 Hyperlipidemia, unspecified: Principal | ICD-10-CM

## 2023-02-04 DIAGNOSIS — Z21 Asymptomatic human immunodeficiency virus [HIV] infection status: Principal | ICD-10-CM

## 2023-02-04 DIAGNOSIS — K6282 Dysplasia of anus: Principal | ICD-10-CM

## 2023-02-04 DIAGNOSIS — I10 Essential (primary) hypertension: Principal | ICD-10-CM

## 2023-02-04 DIAGNOSIS — Z7185 Vaccine counseling: Principal | ICD-10-CM

## 2023-02-04 DIAGNOSIS — F17200 Nicotine dependence, unspecified, uncomplicated: Principal | ICD-10-CM

## 2023-02-04 DIAGNOSIS — E119 Type 2 diabetes mellitus without complications: Principal | ICD-10-CM

## 2023-02-04 MED ORDER — ATORVASTATIN 40 MG TABLET
ORAL_TABLET | ORAL | 3 refills | 90 days | Status: CP
Start: 2023-02-04 — End: 2024-02-04

## 2023-02-16 ENCOUNTER — Telehealth: Admit: 2023-02-16 | Discharge: 2023-02-17 | Attending: Registered" | Primary: Registered"

## 2023-02-16 DIAGNOSIS — E119 Type 2 diabetes mellitus without complications: Principal | ICD-10-CM

## 2023-02-24 DIAGNOSIS — B20 Human immunodeficiency virus [HIV] disease: Principal | ICD-10-CM

## 2023-02-24 MED ORDER — BIKTARVY 50 MG-200 MG-25 MG TABLET
ORAL_TABLET | Freq: Every day | ORAL | 5 refills | 30 days | Status: CP
Start: 2023-02-24 — End: ?

## 2023-03-23 ENCOUNTER — Ambulatory Visit: Admit: 2023-03-23 | Discharge: 2023-03-23

## 2023-03-23 ENCOUNTER — Institutional Professional Consult (permissible substitution): Admit: 2023-03-23 | Discharge: 2023-03-23 | Attending: Registered" | Primary: Registered"

## 2023-03-23 DIAGNOSIS — E119 Type 2 diabetes mellitus without complications: Principal | ICD-10-CM

## 2023-07-27 ENCOUNTER — Ambulatory Visit: Admit: 2023-07-27 | Discharge: 2023-07-28

## 2023-07-27 DIAGNOSIS — Z79899 Other long term (current) drug therapy: Principal | ICD-10-CM

## 2023-07-27 DIAGNOSIS — Z9189 Other specified personal risk factors, not elsewhere classified: Principal | ICD-10-CM

## 2023-07-27 DIAGNOSIS — Z13220 Encounter for screening for lipoid disorders: Principal | ICD-10-CM

## 2023-07-27 DIAGNOSIS — Z5181 Encounter for therapeutic drug level monitoring: Principal | ICD-10-CM

## 2023-07-27 DIAGNOSIS — E119 Type 2 diabetes mellitus without complications: Principal | ICD-10-CM

## 2023-07-27 DIAGNOSIS — B20 Human immunodeficiency virus [HIV] disease: Principal | ICD-10-CM

## 2023-07-27 DIAGNOSIS — Z113 Encounter for screening for infections with a predominantly sexual mode of transmission: Principal | ICD-10-CM

## 2023-07-27 DIAGNOSIS — I10 Essential (primary) hypertension: Principal | ICD-10-CM

## 2023-07-27 MED ORDER — AMLODIPINE 5 MG TABLET
ORAL_TABLET | Freq: Every day | ORAL | 5 refills | 30.00 days | Status: CP
Start: 2023-07-27 — End: ?

## 2023-07-27 MED ORDER — ATORVASTATIN 40 MG TABLET
ORAL_TABLET | Freq: Every day | ORAL | 5 refills | 30.00 days | Status: CP
Start: 2023-07-27 — End: ?

## 2023-07-27 MED ORDER — BIKTARVY 50 MG-200 MG-25 MG TABLET
ORAL_TABLET | Freq: Every day | ORAL | 5 refills | 30.00 days | Status: CP
Start: 2023-07-27 — End: ?

## 2024-02-05 DIAGNOSIS — I10 Essential (primary) hypertension: Principal | ICD-10-CM

## 2024-02-05 DIAGNOSIS — B20 Human immunodeficiency virus [HIV] disease: Principal | ICD-10-CM

## 2024-02-05 DIAGNOSIS — E119 Type 2 diabetes mellitus without complications: Principal | ICD-10-CM

## 2024-02-05 MED ORDER — ATORVASTATIN 40 MG TABLET
ORAL_TABLET | Freq: Every day | ORAL | 0 refills | 30.00000 days | Status: CP
Start: 2024-02-05 — End: ?

## 2024-02-05 MED ORDER — BIKTARVY 50 MG-200 MG-25 MG TABLET
ORAL_TABLET | Freq: Every day | ORAL | 0 refills | 30.00000 days | Status: CP
Start: 2024-02-05 — End: ?

## 2024-02-05 MED ORDER — AMLODIPINE 5 MG TABLET
ORAL_TABLET | Freq: Every day | ORAL | 0 refills | 30.00000 days | Status: CP
Start: 2024-02-05 — End: ?

## 2024-03-02 ENCOUNTER — Encounter: Admit: 2024-03-02 | Discharge: 2024-03-02

## 2024-03-02 DIAGNOSIS — Z113 Encounter for screening for infections with a predominantly sexual mode of transmission: Principal | ICD-10-CM

## 2024-03-02 DIAGNOSIS — E785 Hyperlipidemia, unspecified: Principal | ICD-10-CM

## 2024-03-02 DIAGNOSIS — B20 Human immunodeficiency virus [HIV] disease: Principal | ICD-10-CM

## 2024-03-02 DIAGNOSIS — R739 Hyperglycemia, unspecified: Principal | ICD-10-CM

## 2024-03-02 DIAGNOSIS — Z9189 Other specified personal risk factors, not elsewhere classified: Principal | ICD-10-CM

## 2024-03-02 DIAGNOSIS — E119 Type 2 diabetes mellitus without complications: Principal | ICD-10-CM

## 2024-03-02 DIAGNOSIS — F17200 Nicotine dependence, unspecified, uncomplicated: Principal | ICD-10-CM

## 2024-03-02 DIAGNOSIS — Z1210 Encounter for screening for malignant neoplasm of intestinal tract, unspecified: Principal | ICD-10-CM

## 2024-03-02 DIAGNOSIS — Z5181 Encounter for therapeutic drug level monitoring: Principal | ICD-10-CM

## 2024-03-02 DIAGNOSIS — Z1151 Encounter for screening for human papillomavirus (HPV): Principal | ICD-10-CM

## 2024-03-02 DIAGNOSIS — I10 Essential (primary) hypertension: Principal | ICD-10-CM

## 2024-03-02 DIAGNOSIS — K6282 Dysplasia of anus: Principal | ICD-10-CM

## 2024-03-02 DIAGNOSIS — Z79899 Other long term (current) drug therapy: Principal | ICD-10-CM

## 2024-04-05 ENCOUNTER — Emergency Department
Admit: 2024-04-05 | Discharge: 2024-04-05 | Disposition: A | Discharge status: Home / Self Care | Payer: Medicaid (Managed Care)

## 2024-04-05 ENCOUNTER — Emergency Department: Admit: 2024-04-05 | Discharge: 2024-04-05 | Disposition: A | Discharge status: Home / Self Care | Payer: MEDICAID

## 2024-04-05 MED ORDER — IBUPROFEN 600 MG TABLET
ORAL_TABLET | Freq: Four times a day (QID) | ORAL | 0 refills | 8.00000 days | Status: CP | PRN
Start: 2024-04-05 — End: ?

## 2024-04-05 MED ORDER — HYDROCODONE 5 MG-ACETAMINOPHEN 325 MG TABLET
ORAL_TABLET | ORAL | 0 refills | 2.00000 days | Status: CP | PRN
Start: 2024-04-05 — End: 2024-04-10

## 2024-04-05 MED ORDER — CYCLOBENZAPRINE 5 MG TABLET
ORAL_TABLET | Freq: Three times a day (TID) | ORAL | 0 refills | 7.00000 days | Status: CP
Start: 2024-04-05 — End: ?

## 2024-05-04 ENCOUNTER — Encounter: Payer: Self-pay | Admitting: Emergency Medicine

## 2024-05-04 ENCOUNTER — Emergency Department
Admission: EM | Admit: 2024-05-04 | Discharge: 2024-05-04 | Disposition: A | Discharge status: Home / Self Care | Payer: Self-pay | Attending: Emergency Medicine | Admitting: Emergency Medicine

## 2024-05-04 ENCOUNTER — Other Ambulatory Visit: Payer: Self-pay

## 2024-05-04 DIAGNOSIS — J3489 Other specified disorders of nose and nasal sinuses: Secondary | ICD-10-CM | POA: Insufficient documentation

## 2024-05-04 DIAGNOSIS — R0981 Nasal congestion: Secondary | ICD-10-CM | POA: Insufficient documentation

## 2024-05-04 DIAGNOSIS — R519 Headache, unspecified: Secondary | ICD-10-CM | POA: Insufficient documentation

## 2024-05-04 DIAGNOSIS — H9202 Otalgia, left ear: Secondary | ICD-10-CM | POA: Insufficient documentation

## 2024-05-04 DIAGNOSIS — Z5321 Procedure and treatment not carried out due to patient leaving prior to being seen by health care provider: Secondary | ICD-10-CM | POA: Insufficient documentation

## 2024-05-04 HISTORY — DX: Essential (primary) hypertension: I10

## 2024-05-04 NOTE — ED Triage Notes (Signed)
 Pt reports using nicotine patch for 4 days to try and quit smoking. Pt endorses congestion, runny nose, left ear pain and headache for over a week. States his mom was diagnosed with ear infection today and he has same complaints.

## 2024-05-05 ENCOUNTER — Emergency Department
Admit: 2024-05-05 | Discharge: 2024-05-05 | Disposition: A | Discharge status: Home / Self Care | Payer: PRIVATE HEALTH INSURANCE

## 2024-05-05 DIAGNOSIS — J320 Chronic maxillary sinusitis: Principal | ICD-10-CM

## 2024-05-05 MED ORDER — AMOXICILLIN 875 MG-POTASSIUM CLAVULANATE 125 MG TABLET
ORAL_TABLET | Freq: Two times a day (BID) | ORAL | 0 refills | 10.00000 days | Status: CP
Start: 2024-05-05 — End: 2024-05-15
